# Patient Record
Sex: Male | Born: 1980 | Race: White | Hispanic: No | Marital: Single | Smoking: Current every day smoker
Health system: Southern US, Community
[De-identification: ages and names within clinical notes are randomized; demographics above are authoritative.]

## PROBLEM LIST (undated history)

## (undated) DIAGNOSIS — F419 Anxiety disorder, unspecified: Secondary | ICD-10-CM

## (undated) DIAGNOSIS — B20 Human immunodeficiency virus [HIV] disease: Secondary | ICD-10-CM

## (undated) DIAGNOSIS — Z21 Asymptomatic human immunodeficiency virus [HIV] infection status: Secondary | ICD-10-CM

## (undated) DIAGNOSIS — M419 Scoliosis, unspecified: Secondary | ICD-10-CM

## (undated) HISTORY — DX: Asymptomatic human immunodeficiency virus (hiv) infection status: Z21

## (undated) HISTORY — PX: FOOT SURGERY: SHX648

## (undated) HISTORY — DX: Scoliosis, unspecified: M41.9

## (undated) HISTORY — PX: SPINE SURGERY: SHX786

## (undated) HISTORY — DX: Human immunodeficiency virus (HIV) disease: B20

## (undated) HISTORY — DX: Anxiety disorder, unspecified: F41.9

---

## 2005-08-07 ENCOUNTER — Ambulatory Visit: Payer: Self-pay | Admitting: Infectious Diseases

## 2005-09-04 ENCOUNTER — Ambulatory Visit: Payer: Self-pay | Admitting: Infectious Diseases

## 2005-10-01 ENCOUNTER — Ambulatory Visit: Payer: Self-pay | Admitting: Family Medicine

## 2005-11-25 ENCOUNTER — Ambulatory Visit: Payer: Self-pay | Admitting: Infectious Diseases

## 2005-11-25 ENCOUNTER — Encounter: Admission: RE | Admit: 2005-11-25 | Discharge: 2005-11-25 | Payer: Self-pay | Admitting: Infectious Diseases

## 2005-11-25 ENCOUNTER — Encounter (INDEPENDENT_AMBULATORY_CARE_PROVIDER_SITE_OTHER): Payer: Self-pay | Admitting: *Deleted

## 2005-11-25 LAB — CONVERTED CEMR LAB: CD4 Count: 400 microliters

## 2005-11-28 ENCOUNTER — Emergency Department (HOSPITAL_COMMUNITY): Admission: EM | Admit: 2005-11-28 | Discharge: 2005-11-28 | Payer: Self-pay | Admitting: Family Medicine

## 2005-12-25 ENCOUNTER — Ambulatory Visit: Payer: Self-pay | Admitting: Infectious Diseases

## 2006-01-24 ENCOUNTER — Emergency Department (HOSPITAL_COMMUNITY): Admission: EM | Admit: 2006-01-24 | Discharge: 2006-01-24 | Payer: Self-pay | Admitting: Family Medicine

## 2006-05-12 ENCOUNTER — Ambulatory Visit: Payer: Self-pay | Admitting: Family Medicine

## 2006-07-23 ENCOUNTER — Encounter: Admission: RE | Admit: 2006-07-23 | Discharge: 2006-07-23 | Payer: Self-pay | Admitting: Infectious Diseases

## 2006-07-23 ENCOUNTER — Encounter (INDEPENDENT_AMBULATORY_CARE_PROVIDER_SITE_OTHER): Payer: Self-pay | Admitting: *Deleted

## 2006-07-23 ENCOUNTER — Ambulatory Visit: Payer: Self-pay | Admitting: Infectious Diseases

## 2006-07-23 LAB — CONVERTED CEMR LAB
ALT: 34 units/L (ref 0–40)
Albumin: 4.5 g/dL (ref 3.5–5.2)
Basophils Absolute: 0 10*3/uL (ref 0.0–0.1)
CD4 Count: 690 microliters
CO2: 26 meq/L (ref 19–32)
Calcium: 9 mg/dL (ref 8.4–10.5)
Chloride: 105 meq/L (ref 96–112)
Eosinophils Relative: 2 % (ref 0–5)
Glucose, Bld: 101 mg/dL — ABNORMAL HIGH (ref 70–99)
HCT: 40.2 % (ref 39.0–52.0)
HIV 1 RNA Quant: 49 copies/mL
HIV 1 RNA Quant: <50 copies/mL (ref <50)
HIV-1 RNA Quant, Log: <1.7 (ref <1.70)
Leukocyte count, blood: 10.9 10*9/L — ABNORMAL HIGH (ref 4.0–10.5)
Lymphocytes Relative: 23 % (ref 12–46)
Lymphs Abs: 2.5 10*3/uL (ref 0.7–3.3)
Neutro Abs: 7.4 10*3/uL (ref 1.7–7.7)
Platelets: 261 10*3/uL (ref 150–400)
Potassium: 4.4 meq/L (ref 3.5–5.3)
RDW: 12.8 % (ref 11.5–14.0)
Sodium: 142 meq/L (ref 135–145)
Total Protein: 7 g/dL (ref 6.0–8.3)

## 2006-07-31 DIAGNOSIS — B20 Human immunodeficiency virus [HIV] disease: Secondary | ICD-10-CM | POA: Insufficient documentation

## 2006-08-11 ENCOUNTER — Emergency Department (HOSPITAL_COMMUNITY): Admission: EM | Admit: 2006-08-11 | Discharge: 2006-08-11 | Payer: Self-pay | Admitting: Family Medicine

## 2006-08-21 ENCOUNTER — Ambulatory Visit: Payer: Self-pay | Admitting: Infectious Diseases

## 2006-09-18 ENCOUNTER — Ambulatory Visit: Payer: Self-pay | Admitting: Family Medicine

## 2006-09-22 ENCOUNTER — Emergency Department (HOSPITAL_COMMUNITY): Admission: EM | Admit: 2006-09-22 | Discharge: 2006-09-22 | Payer: Self-pay | Admitting: Emergency Medicine

## 2006-09-24 ENCOUNTER — Ambulatory Visit: Payer: Self-pay | Admitting: Family Medicine

## 2006-09-29 ENCOUNTER — Ambulatory Visit: Payer: Self-pay | Admitting: Family Medicine

## 2006-10-09 ENCOUNTER — Ambulatory Visit: Payer: Self-pay | Admitting: Family Medicine

## 2006-11-17 ENCOUNTER — Ambulatory Visit: Payer: Self-pay | Admitting: Infectious Diseases

## 2006-11-17 ENCOUNTER — Encounter (INDEPENDENT_AMBULATORY_CARE_PROVIDER_SITE_OTHER): Payer: Self-pay | Admitting: *Deleted

## 2006-11-17 ENCOUNTER — Encounter: Admission: RE | Admit: 2006-11-17 | Discharge: 2006-11-17 | Payer: Self-pay | Admitting: Infectious Diseases

## 2006-11-17 LAB — CONVERTED CEMR LAB
ALT: 24 units/L (ref 0–53)
AST: 18 units/L (ref 0–37)
Albumin: 4.7 g/dL (ref 3.5–5.2)
Alkaline Phosphatase: 88 units/L (ref 39–117)
BUN: 10 mg/dL (ref 6–23)
Basophils Absolute: 0 10*3/uL (ref 0.0–0.1)
Basophils Relative: 0 % (ref 0–1)
Eosinophils Absolute: 0.2 10*3/uL (ref 0.0–0.7)
MCHC: 33.7 g/dL (ref 30.0–36.0)
MCV: 96.4 fL (ref 78.0–100.0)
Monocytes Relative: 6 % (ref 3–11)
Neutro Abs: 7.3 10*3/uL (ref 1.7–7.7)
Neutrophils Relative %: 68 % (ref 43–77)
Platelets: 298 10*3/uL (ref 150–400)
Potassium: 4 meq/L (ref 3.5–5.3)
RBC: 4.46 M/uL (ref 4.22–5.81)
RDW: 12.8 % (ref 11.5–14.0)

## 2006-11-30 ENCOUNTER — Encounter (INDEPENDENT_AMBULATORY_CARE_PROVIDER_SITE_OTHER): Payer: Self-pay | Admitting: *Deleted

## 2006-12-02 ENCOUNTER — Encounter (INDEPENDENT_AMBULATORY_CARE_PROVIDER_SITE_OTHER): Payer: Self-pay | Admitting: *Deleted

## 2006-12-02 ENCOUNTER — Encounter: Payer: Self-pay | Admitting: Infectious Diseases

## 2007-01-26 ENCOUNTER — Ambulatory Visit: Payer: Self-pay | Admitting: Family Medicine

## 2007-02-13 ENCOUNTER — Emergency Department (HOSPITAL_COMMUNITY): Admission: EM | Admit: 2007-02-13 | Discharge: 2007-02-13 | Payer: Self-pay | Admitting: Family Medicine

## 2007-03-11 ENCOUNTER — Emergency Department (HOSPITAL_COMMUNITY): Admission: EM | Admit: 2007-03-11 | Discharge: 2007-03-11 | Payer: Self-pay | Admitting: Emergency Medicine

## 2007-05-19 ENCOUNTER — Emergency Department (HOSPITAL_COMMUNITY): Admission: EM | Admit: 2007-05-19 | Discharge: 2007-05-19 | Payer: Self-pay | Admitting: Emergency Medicine

## 2007-06-10 ENCOUNTER — Telehealth: Payer: Self-pay | Admitting: Infectious Diseases

## 2007-06-10 DIAGNOSIS — L988 Other specified disorders of the skin and subcutaneous tissue: Secondary | ICD-10-CM

## 2007-06-11 ENCOUNTER — Telehealth: Payer: Self-pay | Admitting: Internal Medicine

## 2007-06-17 ENCOUNTER — Encounter: Payer: Self-pay | Admitting: Infectious Diseases

## 2007-07-28 ENCOUNTER — Telehealth: Payer: Self-pay | Admitting: Infectious Diseases

## 2007-09-22 ENCOUNTER — Encounter: Payer: Self-pay | Admitting: Infectious Diseases

## 2007-09-22 ENCOUNTER — Encounter (INDEPENDENT_AMBULATORY_CARE_PROVIDER_SITE_OTHER): Payer: Self-pay | Admitting: *Deleted

## 2007-11-25 ENCOUNTER — Telehealth (INDEPENDENT_AMBULATORY_CARE_PROVIDER_SITE_OTHER): Payer: Self-pay | Admitting: *Deleted

## 2007-12-11 ENCOUNTER — Encounter (INDEPENDENT_AMBULATORY_CARE_PROVIDER_SITE_OTHER): Payer: Self-pay | Admitting: *Deleted

## 2007-12-29 ENCOUNTER — Encounter (INDEPENDENT_AMBULATORY_CARE_PROVIDER_SITE_OTHER): Payer: Self-pay | Admitting: *Deleted

## 2008-01-14 ENCOUNTER — Encounter: Payer: Self-pay | Admitting: Infectious Diseases

## 2008-01-28 ENCOUNTER — Telehealth: Payer: Self-pay

## 2008-04-28 ENCOUNTER — Encounter: Admission: RE | Admit: 2008-04-28 | Discharge: 2008-04-28 | Payer: Self-pay | Admitting: Infectious Diseases

## 2008-04-28 ENCOUNTER — Ambulatory Visit: Payer: Self-pay | Admitting: Infectious Diseases

## 2008-04-28 LAB — CONVERTED CEMR LAB
ALT: 26 units/L (ref 0–53)
AST: 21 units/L (ref 0–37)
Albumin: 4.7 g/dL (ref 3.5–5.2)
Alkaline Phosphatase: 64 units/L (ref 39–117)
CO2: 23 meq/L (ref 19–32)
Calcium: 9.3 mg/dL (ref 8.4–10.5)
Eosinophils Relative: 2 % (ref 0–5)
GC Probe Amp, Urine: NEGATIVE
Glucose, Bld: 99 mg/dL (ref 70–99)
HCT: 40.9 % (ref 39.0–52.0)
HIV-1 RNA Quant, Log: <1.7 (ref <1.70)
Hemoglobin, Urine: NEGATIVE
Hemoglobin: 14 g/dL (ref 13.0–17.0)
Ketones, ur: NEGATIVE mg/dL
LDL Cholesterol: 87 mg/dL (ref 0–99)
Leukocytes, UA: NEGATIVE
Lymphocytes Relative: 22 % (ref 12–46)
Lymphs Abs: 2.8 10*3/uL (ref 0.7–4.0)
MCV: 97.6 fL (ref 78.0–100.0)
Monocytes Absolute: 0.9 10*3/uL (ref 0.1–1.0)
Monocytes Relative: 7 % (ref 3–12)
Nitrite: NEGATIVE
Specific Gravity, Urine: 1.036 — ABNORMAL HIGH (ref 1.005–1.03)
Urobilinogen, UA: 0.2 (ref 0.0–1.0)
WBC: 12.8 10*3/uL — ABNORMAL HIGH (ref 4.0–10.5)
pH: 6 (ref 5.0–8.0)

## 2008-05-12 ENCOUNTER — Ambulatory Visit: Payer: Self-pay | Admitting: Infectious Diseases

## 2008-07-15 ENCOUNTER — Encounter: Payer: Self-pay | Admitting: Infectious Diseases

## 2008-07-19 ENCOUNTER — Telehealth (INDEPENDENT_AMBULATORY_CARE_PROVIDER_SITE_OTHER): Payer: Self-pay | Admitting: *Deleted

## 2008-07-20 ENCOUNTER — Telehealth (INDEPENDENT_AMBULATORY_CARE_PROVIDER_SITE_OTHER): Payer: Self-pay | Admitting: *Deleted

## 2008-07-26 ENCOUNTER — Telehealth: Payer: Self-pay | Admitting: Infectious Diseases

## 2008-08-01 ENCOUNTER — Encounter: Payer: Self-pay | Admitting: Infectious Diseases

## 2008-08-01 DIAGNOSIS — M549 Dorsalgia, unspecified: Secondary | ICD-10-CM | POA: Insufficient documentation
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
## 2008-11-29 ENCOUNTER — Encounter (INDEPENDENT_AMBULATORY_CARE_PROVIDER_SITE_OTHER): Payer: Self-pay | Admitting: *Deleted

## 2008-12-14 ENCOUNTER — Encounter (INDEPENDENT_AMBULATORY_CARE_PROVIDER_SITE_OTHER): Payer: Self-pay | Admitting: *Deleted

## 2009-07-26 ENCOUNTER — Ambulatory Visit: Payer: Self-pay | Admitting: Infectious Diseases

## 2009-07-26 LAB — CONVERTED CEMR LAB
ALT: 24 units/L (ref 0–53)
AST: 19 units/L (ref 0–37)
Alkaline Phosphatase: 65 units/L (ref 39–117)
BUN: 8 mg/dL (ref 6–23)
Basophils Absolute: 0 10*3/uL (ref 0.0–0.1)
Basophils Relative: 0 % (ref 0–1)
Calcium: 9 mg/dL (ref 8.4–10.5)
Creatinine, Ser: 0.78 mg/dL (ref 0.40–1.50)
Eosinophils Absolute: 0.3 10*3/uL (ref 0.0–0.7)
Eosinophils Relative: 3 % (ref 0–5)
HCT: 39.4 % (ref 39.0–52.0)
HDL: 36 mg/dL — ABNORMAL LOW (ref >39)
HIV 1 RNA Quant: <48 copies/mL (ref <48)
LDL Cholesterol: 84 mg/dL (ref 0–99)
Lymphs Abs: 3.2 10*3/uL (ref 0.7–4.0)
MCV: 95.2 fL (ref >=78.0)
Neutrophils Relative %: 59 % (ref 43–77)
Platelets: 291 10*3/uL (ref 150–400)
RDW: 13 % (ref 11.5–15.5)
Total Bilirubin: 0.2 mg/dL — ABNORMAL LOW (ref 0.3–1.2)
Total CHOL/HDL Ratio: 3.8
VLDL: 18 mg/dL (ref 0–40)
WBC: 10.2 10*3/uL (ref 4.0–10.5)

## 2009-08-10 ENCOUNTER — Ambulatory Visit: Payer: Self-pay | Admitting: Infectious Diseases

## 2009-08-10 ENCOUNTER — Ambulatory Visit (HOSPITAL_COMMUNITY): Admission: RE | Admit: 2009-08-10 | Discharge: 2009-08-10 | Payer: Self-pay | Admitting: Infectious Diseases

## 2009-12-18 ENCOUNTER — Encounter (INDEPENDENT_AMBULATORY_CARE_PROVIDER_SITE_OTHER): Payer: Self-pay | Admitting: *Deleted

## 2010-01-09 ENCOUNTER — Emergency Department (HOSPITAL_COMMUNITY): Admission: EM | Admit: 2010-01-09 | Discharge: 2010-01-09 | Payer: Self-pay | Admitting: Family Medicine

## 2010-01-11 ENCOUNTER — Ambulatory Visit: Payer: Self-pay | Admitting: Infectious Diseases

## 2010-01-11 DIAGNOSIS — F411 Generalized anxiety disorder: Secondary | ICD-10-CM | POA: Insufficient documentation

## 2010-01-30 ENCOUNTER — Ambulatory Visit: Payer: Self-pay | Admitting: Infectious Diseases

## 2010-01-30 LAB — CONVERTED CEMR LAB
ALT: 17 units/L (ref 0–53)
Alkaline Phosphatase: 65 units/L (ref 39–117)
Basophils Absolute: 0 10*3/uL (ref 0.0–0.1)
Basophils Relative: 0 % (ref 0–1)
CO2: 25 meq/L (ref 19–32)
Creatinine, Ser: 0.81 mg/dL (ref 0.40–1.50)
Eosinophils Absolute: 0.2 10*3/uL (ref 0.0–0.7)
Glucose, Bld: 84 mg/dL (ref 70–99)
HIV 1 RNA Quant: <48 copies/mL (ref <48)
HIV-1 RNA Quant, Log: <1.68 (ref <1.68)
MCHC: 33.3 g/dL (ref 30.0–36.0)
MCV: 95.2 fL (ref 78.0–100.0)
Monocytes Relative: 4 % (ref 3–12)
Neutrophils Relative %: 67 % (ref 43–77)
RBC: 4.33 M/uL (ref 4.22–5.81)
RDW: 12.9 % (ref 11.5–15.5)
Total Bilirubin: 0.2 mg/dL — ABNORMAL LOW (ref 0.3–1.2)

## 2010-02-08 ENCOUNTER — Ambulatory Visit: Payer: Self-pay | Admitting: Infectious Diseases

## 2010-02-13 ENCOUNTER — Telehealth: Payer: Self-pay | Admitting: Infectious Diseases

## 2010-04-17 ENCOUNTER — Telehealth (INDEPENDENT_AMBULATORY_CARE_PROVIDER_SITE_OTHER): Payer: Self-pay | Admitting: *Deleted

## 2010-05-31 ENCOUNTER — Ambulatory Visit: Payer: Self-pay | Admitting: Infectious Diseases

## 2010-05-31 LAB — CONVERTED CEMR LAB
ALT: 12 units/L (ref 0–53)
BUN: 6 mg/dL (ref 6–23)
CO2: 28 meq/L (ref 19–32)
Calcium: 8.8 mg/dL (ref 8.4–10.5)
Chloride: 108 meq/L (ref 96–112)
Creatinine, Ser: 0.72 mg/dL (ref 0.40–1.50)
Glucose, Bld: 73 mg/dL (ref 70–99)
HIV-1 RNA Quant, Log: <1.3 (ref <1.68)
Hemoglobin: 12.7 g/dL — ABNORMAL LOW (ref 13.0–17.0)
Lymphocytes Relative: 37 % (ref 12–46)
Monocytes Absolute: 0.4 10*3/uL (ref 0.1–1.0)
Monocytes Relative: 6 % (ref 3–12)
Neutro Abs: 4.1 10*3/uL (ref 1.7–7.7)
RBC: 3.97 M/uL — ABNORMAL LOW (ref 4.22–5.81)
Testosterone: 445.24 ng/dL (ref 350–890)

## 2010-06-14 ENCOUNTER — Ambulatory Visit: Payer: Self-pay | Admitting: Infectious Diseases

## 2010-09-04 ENCOUNTER — Telehealth: Payer: Self-pay | Admitting: Infectious Diseases

## 2010-09-11 ENCOUNTER — Telehealth: Payer: Self-pay | Admitting: Infectious Diseases

## 2010-10-17 ENCOUNTER — Ambulatory Visit: Admit: 2010-10-17 | Payer: Self-pay | Admitting: Infectious Diseases

## 2010-10-23 NOTE — Assessment & Plan Note (Signed)
Summary: back pain also X-ray result   CC:  Out of pain med, to discuss "anxiety" with MD.  RN offered Counselor, Aldean Ast, and as a method of coping with "every day stress.".  Preventive Screening-Counseling & Management  Alcohol-Tobacco     Alcohol drinks/day: 0     Smoking Status: current     Smoking Cessation Counseling: yes     Smoke Cessation Stage: contemplative     Packs/Day: 0.5     Passive Smoke Exposure: no  Caffeine-Diet-Exercise     Caffeine use/day: 3     Does Patient Exercise: yes     Type of exercise: gym membership     Times/week: 3  Hep-HIV-STD-Contraception     HIV Risk: no risk noted     HIV Risk Counseling: not indicated-no HIV risk noted  Safety-Violence-Falls     Seat Belt Use: 100  Comments: declined condoms      Sexual History:  n/a.        Drug Use:  former.     Current Allergies (reviewed today): ! PCN Social History: Sexual History:  n/a Drug Use:  former  Vital Signs:  Patient profile:   30 year old male Height:      71 inches (180.34 cm) Weight:      161.5 pounds (73.41 kg) BMI:     22.61 Temp:     98.8 degrees F (37.11 degrees C) oral Pulse rate:   103 / minute BP sitting:   136 / 80  (left arm) Cuff size:   regular  Vitals Entered By: Jennet Maduro RN (January 11, 2010 2:54 PM) CC: Out of pain med, to discuss "anxiety" with MD.  RN offered Counselor, Aldean Ast, as a method of coping with "every day stress." Is Patient Diabetic? No Pain Assessment Patient in pain? yes     Location: back Intensity: 8 Type: aching Onset of pain  Chronic, "going down legs" Nutritional Status BMI of 19 -24 = normal Nutritional Status Detail appetite "unchanged"  Have you ever been in a relationship where you felt threatened, hurt or afraid?No   Does patient need assistance? Functional Status Self care Ambulation Normal Comments missed 2 doses a couple of months ago    Medications Added to Medication List This Visit: 1)   Norco 7.5-325 Mg Tabs (Hydrocodone-acetaminophen) .... Take 1 tablet by mouth every 6 hours as needed back pain 2)  Hydrocodone-acetaminophen 10-325 Mg Tabs (Hydrocodone-acetaminophen) .... Take 1/2 tablet by mouth every 6 hours as needed back pain 3)  Hydrocodone-acetaminophen 5-325 Mg Tabs (Hydrocodone-acetaminophen) .... Take 1 tablet by mouth every 6 hours as needed for pain 4)  Alprazolam 0.5 Mg Tabs (Alprazolam) .Marland Kitchen.. 1 tab three times a day as needed  Other Orders: Misc. Referral (Misc. Ref) Est. Patient Level IV (09811) Prescriptions: NORCO 7.5-325 MG TABS (HYDROCODONE-ACETAMINOPHEN) Take 1 tablet by mouth every 6 hours as needed back pain  #120 x 5   Entered by:   Jennet Maduro RN   Authorized by:   Lina Sayre MD   Signed by:   Jennet Maduro RN on 01/11/2010   Method used:   Print then Give to Patient   RxID:   [id number] HYDROCODONE-ACETAMINOPHEN 10-325 MG TABS (HYDROCODONE-ACETAMINOPHEN) Take 1/2 tablet by mouth every 6 hours as needed back pain  #90 x 5   Entered by:   Jennet Maduro RN   Authorized by:   Lina Sayre MD   Signed by:   Jennet Maduro RN on 01/11/2010  Method used:   Print then Give to Patient   RxID:   [id number] ALPRAZOLAM 0.5 MG TABS (ALPRAZOLAM) 1 tab three times a day as needed  #90 x 5   Entered by:   Jennet Maduro RN   Authorized by:   Lina Sayre MD   Signed by:   Jennet Maduro RN on 01/11/2010   Method used:   Print then Give to Patient   RxID:   [id number] HYDROCODONE-ACETAMINOPHEN 7.5-500 MG TABS (HYDROCODONE-ACETAMINOPHEN) Take 1 tablet by mouth every 6 hours as needed back pain  #120 x 5   Entered by:   Jennet Maduro RN   Authorized by:   Lina Sayre MD   Signed by:   Jennet Maduro RN on 01/11/2010   Method used:   Print then Give to Patient   RxID:   [id number] ALPRAZOLAM 0.5 MG TABS (ALPRAZOLAM) 1 tab three times a day as needed  #90 x 5   Entered and Authorized by:   Lina Sayre MD   Signed by:    Lina Sayre MD on 01/11/2010   Method used:   Print then Give to Patient   RxID:   [id number] HYDROCODONE-ACETAMINOPHEN 7.5-500 MG TABS (HYDROCODONE-ACETAMINOPHEN) Take 1 tablet by mouth every 6 hours as needed back pain  #120 x 5   Entered and Authorized by:   Lina Sayre MD   Signed by:   Lina Sayre MD on 01/11/2010   Method used:   Print then Give to Patient   RxID:   [id number]  Hydrocodone dose changed due to needing to reduce amount of apap. Jennet Maduro RN  January 11, 2010  3:53 PM  Prescription changed again after pt. returned to office wanting increased amount of pills thinking the dose was supposed to be 1/2 to 1 tablet every 6 hours.  RN spoke to Dr. Maurice March.  Dr. Ambrose Pancoast changed the medication to 7.5 / 325 hydrocodone/APAP tablets Take 1 tablet by mouth every 6 hours as needed pain.  New prescription printed.  RN went to talk with pt. at the front desk.  He had already had the rx filled at the Pharmacy.  RN reviewed how Dr. Maurice March wanted him to take the medication, the amount of hydrocodone in each tablet, and not take too much pain  medication or APAP.  Pt. agreed that he would take only 1/2 tablet of the 10/325 strength tablets.  Rx for hydrocodone 7.5/ 325 tablets was destroyed in front of Dr. Maurice March. Jennet Maduro RN  January 11, 2010 4:57 PM

## 2010-10-23 NOTE — Progress Notes (Signed)
Summary: Request early refill on Hydrocodone, NOT authorizded  Phone Note From Pharmacy   Caller: [facility] Details for Reason: Request early refill on Hydrocodone Summary of Call: Received a voicemail message in reference to [employer].  Tammy said pateint called to tell them he wants a refill on his pain med because he lost medication down the drain.  Tammy said she remembers there being an issue about this medication on 5/16 with the quantity.  She can't fill it without Dr. Maurice March saying it okay for an early refill.  she would like for somone to call her back at [phone] or [phone] Initial call taken by: Paulo Fruit  BS,CPht II,MPH,  Feb 13, 2010 2:50 PM  Follow-up for Phone Call        RN spoke with [facility].  Pt. originally filled the 10/325 mg rx on 01/11/2010 for 60 tablets.  He refilled the rx on 02/06/20 (early refill) for 60 tablets.  RN will check with Dr. Maurice March today about refilling the rx due to the pt. loosing a portion of the rx "down the drain" at home. Jennet Maduro RN  Feb 15, 2010 10:02 AM RN spoke with Dr. Maurice March.  He will NOT authorize a refill.  The pt. will need to wait until it is time to refill the rx.  Dr. Maurice March stated that the pt. needs to take better care of his medications.  RN phoned [facility] and shared this message.  Pharmacist repeated the order. Jennet Maduro RN  Feb 15, 2010 10:22 AM     New/Updated Medications: HYDROCODONE-ACETAMINOPHEN 10-325 MG TABS (HYDROCODONE-ACETAMINOPHEN) Take 1/2 tablet by mouth every 6 hours as needed for back pain
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
## 2010-10-23 NOTE — Miscellaneous (Signed)
Summary: clinical update/ryan white [place] app completed  Clinical Lists Changes  Observations: Added new observation of PCTFPL: 93.14  (12/18/2009 14:44) Added new observation of INCOMESOURCE: Other  (12/18/2009 14:44) Added new observation of HOUSEINCOME: 16[address]  (12/18/2009 14:44) Added new observation of FINASSESSDT: 12/13/2009  (12/18/2009 14:44)

## 2010-10-23 NOTE — Progress Notes (Signed)
Summary: New ADAP pharmacy.  Phone Note Call from Patient   Caller: Patient Reason for Call: Refill Medication Summary of Call: Patient called stating that he did not know that the ADAP pharmacy changed from [facility] to [facility].  He did contact the [facility] and they did not have him in their system.  Patient has 4 days left on his medications [facility]  He would like prescriptions called in if possible. Initial call taken by: Paulo Fruit  BS,CPht II,MPH,  April 17, 2010 12:41 PM    Prescriptions: ATRIPLA 600-200-300 MG TABS (EFAVIRENZ-EMTRICITAB-TENOFOVIR) Take 1 tablet by mouth at bedtime  #30 x 11   Entered by:   Paulo Fruit  BS,CPht II,MPH   Authorized by:   Lina Sayre MD   Signed by:   Paulo Fruit  BS,CPht II,MPH on 04/17/2010   Method used:   Electronically to        [facility] [phone]* (retail)       [address]       [place], [place]  [address]       Ph: [phone]       Fax:    RxID:   [id number]  Paulo Fruit  BS,CPht II,MPH  April 17, 2010 12:42 PM

## 2010-10-25 NOTE — Progress Notes (Addendum)
Summary: Pain rx request, MD notified, pt given script  Phone Note Call from Patient Call back at cell [phone]   Caller: Patient Call For: Lina Sayre MD Reason for Call: Refill Medication Summary of Call: Pt. requesting refills on Hydrocodone/APAP 10/325 and Alprazolam 0.5 mg tabs.  RN will let Dr. Maurice March know about this request.  RN emailed  Dr. Maurice March with this request.  Jennet Maduro RN  September 04, 2010 4:25 PM   Follow-up for Phone Call        pt. had #90 with 5 refills in September Follow-up by: Wendall Mola CMA Duncan Dull),  September 07, 2010 4:57 PM    Prescriptions: HYDROCODONE-ACETAMINOPHEN 10-325 MG TABS (HYDROCODONE-ACETAMINOPHEN) Take 1/2 tablet by mouth every 6 hours as needed for back pain  #60 x 0   Entered by:   Jennet Maduro RN   Authorized by:   Talmadge Chad NP   Signed by:   Jennet Maduro RN on 09/10/2010   Method used:   Print then Give to Patient   RxID:   [id number]  Pt. picked up script today. Jennet Maduro RN  September 10, 2010 3:39 PM

## 2010-11-29 NOTE — Assessment & Plan Note (Signed)
Summary: F/U OV/VS   CC:  follow-up visit.  Preventive Screening-Counseling & Management  Alcohol-Tobacco     Alcohol drinks/day: 0     Smoking Status: current     Smoking Cessation Counseling: yes     Smoke Cessation Stage: contemplative     Packs/Day: 0.25     Passive Smoke Exposure: no  Caffeine-Diet-Exercise     Caffeine use/day: sodas     Does Patient Exercise: yes     Type of exercise: gym membership     Exercise (avg: min/session): 30-60     Times/week: 3  Safety-Violence-Falls     Seat Belt Use: yes   Current Allergies (reviewed today): ! PCN Vital Signs:  Patient profile:   30 year old male Height:      71 inches (180.34 cm) Weight:      161.0 pounds (73.18 kg) BMI:     22.54 Temp:     98.5 degrees F (36.94 degrees C) oral Pulse rate:   96 / minute BP sitting:   125 / 78  (left arm)  Vitals Entered By: Baxter Hire) (June 14, 2010 3:14 PM) CC: follow-up visit Pain Assessment Patient in pain? no      Nutritional Status BMI of 19 -24 = normal Nutritional Status Detail appetite is okay per patient  Have you ever been in a relationship where you felt threatened, hurt or afraid?No   Does patient need assistance? Functional Status Self care Ambulation Normal    Other Orders: Est. Patient Level III (16109) Future Orders: T-CBC w/Diff ([id number]) ... 11/14/2010 T-CD4SP ([facility]) (CD4SP) ... 11/14/2010 T-Comprehensive Metabolic Panel [phone]) ... 11/14/2010 T-HIV Viral Load [phone]) ... 11/14/2010  Patient Instructions: 1)  Please schedule a follow-up appointment in 4-6 months. 2)  Be sure to return for lab work one (1) week before your next appointment as scheduled.  Process Orders Check Orders Results:     [facility]: ABN not required for this insurance Tests Sent for requisitioning (November 20, 2010 10:37 AM):     11/14/2010: [facility] -- T-CBC w/Diff [[id number]] (signed)  11/14/2010: [facility] -- T-Comprehensive Metabolic Panel [[id number]] (signed)     11/14/2010: [facility] -- T-HIV Viral Load [phone] (signed)    Process Orders Check Orders Results:     [facility]: ABN not required for this insurance Tests Sent for requisitioning (November 20, 2010 10:37 AM):     11/14/2010: [facility] -- T-CBC w/Diff [[id number]] (signed)     11/14/2010: [facility] -- T-Comprehensive Metabolic Panel [[id number]] (signed)     11/14/2010: [facility] -- T-HIV Viral Load [phone] (signed)

## 2010-11-29 NOTE — Progress Notes (Signed)
Summary: Pharmacy calling re Narcotic script  Phone Note From Pharmacy   Caller: [facility]  [phone] Summary of Call: Pt says this is not his correct script the dose and the number is not correct.  Per Nida Boatman the script is was written according to the directions he received from Dr Maurice March , no exceptions. Tomasita Morrow RN  September 11, 2010 4:52 PM  Initial call taken by: Tomasita Morrow RN,  September 11, 2010 4:52 PM

## 2010-12-06 LAB — T-HELPER CELL (CD4) - (RCID CLINIC ONLY): CD4 T Cell Abs: 1050 uL (ref 400–2700)

## 2010-12-11 LAB — POCT RAPID STREP A (OFFICE): Streptococcus, Group A Screen (Direct): NEGATIVE

## 2010-12-13 ENCOUNTER — Other Ambulatory Visit: Payer: Self-pay

## 2010-12-13 DIAGNOSIS — B20 Human immunodeficiency virus [HIV] disease: Secondary | ICD-10-CM

## 2010-12-14 LAB — CBC WITH DIFFERENTIAL/PLATELET
Basophils Relative: 0 % (ref 0–1)
HCT: 41.8 % (ref 39.0–52.0)
Hemoglobin: 14.5 g/dL (ref 13.0–17.0)
MCHC: 34.7 g/dL (ref 30.0–36.0)
Monocytes Absolute: 0.7 10*3/uL (ref 0.1–1.0)
Monocytes Relative: 6 % (ref 3–12)
Neutro Abs: 7.9 10*3/uL — ABNORMAL HIGH (ref 1.7–7.7)

## 2010-12-14 LAB — COMPLETE METABOLIC PANEL WITH GFR
Albumin: 4.7 g/dL (ref 3.5–5.2)
Alkaline Phosphatase: 76 U/L (ref 39–117)
BUN: 9 mg/dL (ref 6–23)
Calcium: 9.6 mg/dL (ref 8.4–10.5)
Chloride: 105 mEq/L (ref 96–112)
GFR, Est African American: >60 mL/min (ref >60)
GFR, Est Non African American: >60 mL/min (ref >60)
Glucose, Bld: 103 mg/dL — ABNORMAL HIGH (ref 70–99)
Potassium: 4.4 mEq/L (ref 3.5–5.3)

## 2010-12-14 LAB — CD4/CD8 (T-HELPER/T-SUPPRESSOR CELL): Ratio: 0.78 — ABNORMAL LOW (ref 1.0–3.0)

## 2010-12-15 LAB — HIV-1 RNA QUANT-NO REFLEX-BLD: HIV 1 RNA Quant: 27 copies/mL — ABNORMAL HIGH (ref <20)

## 2010-12-26 LAB — T-HELPER CELL (CD4) - (RCID CLINIC ONLY)
CD4 % Helper T Cell: 33 % (ref 33–55)
CD4 T Cell Abs: 990 uL (ref 400–2700)

## 2010-12-27 ENCOUNTER — Ambulatory Visit: Payer: Self-pay | Admitting: Infectious Diseases

## 2011-02-07 ENCOUNTER — Ambulatory Visit: Payer: Self-pay | Admitting: Infectious Diseases

## 2011-02-13 ENCOUNTER — Ambulatory Visit (INDEPENDENT_AMBULATORY_CARE_PROVIDER_SITE_OTHER): Payer: Self-pay

## 2011-02-13 ENCOUNTER — Inpatient Hospital Stay (INDEPENDENT_AMBULATORY_CARE_PROVIDER_SITE_OTHER)
Admission: RE | Admit: 2011-02-13 | Discharge: 2011-02-13 | Disposition: A | Discharge status: Home / Self Care | Payer: Self-pay | Source: Ambulatory Visit | Attending: Emergency Medicine | Admitting: Emergency Medicine

## 2011-02-13 DIAGNOSIS — S9030XA Contusion of unspecified foot, initial encounter: Secondary | ICD-10-CM

## 2011-03-14 ENCOUNTER — Encounter: Payer: Self-pay | Admitting: Infectious Diseases

## 2011-03-14 ENCOUNTER — Other Ambulatory Visit: Payer: Self-pay | Admitting: *Deleted

## 2011-03-14 ENCOUNTER — Ambulatory Visit (INDEPENDENT_AMBULATORY_CARE_PROVIDER_SITE_OTHER): Payer: Self-pay | Admitting: Infectious Diseases

## 2011-03-14 DIAGNOSIS — F419 Anxiety disorder, unspecified: Secondary | ICD-10-CM

## 2011-03-14 DIAGNOSIS — Z113 Encounter for screening for infections with a predominantly sexual mode of transmission: Secondary | ICD-10-CM

## 2011-03-14 DIAGNOSIS — R52 Pain, unspecified: Secondary | ICD-10-CM

## 2011-03-14 DIAGNOSIS — N529 Male erectile dysfunction, unspecified: Secondary | ICD-10-CM

## 2011-03-14 DIAGNOSIS — Z79899 Other long term (current) drug therapy: Secondary | ICD-10-CM

## 2011-03-14 DIAGNOSIS — B20 Human immunodeficiency virus [HIV] disease: Secondary | ICD-10-CM

## 2011-03-14 LAB — CBC WITH DIFFERENTIAL/PLATELET
Basophils Absolute: 0.1 10*3/uL (ref 0.0–0.1)
Eosinophils Relative: 3 % (ref 0–5)
HCT: 40.9 % (ref 39.0–52.0)
Hemoglobin: 14.3 g/dL (ref 13.0–17.0)
Lymphocytes Relative: 33 % (ref 12–46)
Lymphs Abs: 2.6 10*3/uL (ref 0.7–4.0)
MCV: 94.2 fL (ref 78.0–100.0)
Monocytes Absolute: 0.6 10*3/uL (ref 0.1–1.0)
Monocytes Relative: 8 % (ref 3–12)
Neutro Abs: 4.2 10*3/uL (ref 1.7–7.7)
RBC: 4.34 MIL/uL (ref 4.22–5.81)
RDW: 13 % (ref 11.5–15.5)
WBC: 7.6 10*3/uL (ref 4.0–10.5)

## 2011-03-14 LAB — LIPID PANEL
Cholesterol: 164 mg/dL (ref 0–200)
Triglycerides: 49 mg/dL (ref <150)

## 2011-03-14 MED ORDER — HYDROCODONE-ACETAMINOPHEN 10-325 MG PO TABS: 0.5000 | ORAL_TABLET | Freq: Four times a day (QID) | ORAL | Status: DC | PRN

## 2011-03-14 MED ORDER — ALPRAZOLAM 0.5 MG PO TABS: 0.5000 mg | ORAL_TABLET | Freq: Three times a day (TID) | ORAL | Status: DC | PRN

## 2011-03-14 MED ORDER — HYDROCODONE-ACETAMINOPHEN 10-325 MG PO TABS: 1.0000 | ORAL_TABLET | Freq: Three times a day (TID) | ORAL | Status: DC | PRN

## 2011-03-15 LAB — COMPLETE METABOLIC PANEL WITH GFR
AST: 19 U/L (ref 0–37)
Albumin: 4.9 g/dL (ref 3.5–5.2)
BUN: 10 mg/dL (ref 6–23)
CO2: 27 mEq/L (ref 19–32)
Calcium: 9.3 mg/dL (ref 8.4–10.5)
Chloride: 105 mEq/L (ref 96–112)
Creat: 0.77 mg/dL (ref 0.50–1.35)
GFR, Est African American: >60 mL/min (ref >60)
Potassium: 4.3 mEq/L (ref 3.5–5.3)

## 2011-03-15 LAB — URINALYSIS, ROUTINE W REFLEX MICROSCOPIC

## 2011-03-15 LAB — TESTOSTERONE, FREE, TOTAL, SHBG
Testosterone, Free: 58.9 pg/mL (ref 47.0–244.0)
Testosterone-% Free: 1.2 % — ABNORMAL LOW (ref 1.6–2.9)

## 2011-03-15 LAB — GC/CHLAMYDIA PROBE AMP, URINE
Chlamydia, Swab/Urine, PCR: NEGATIVE
GC Probe Amp, Urine: NEGATIVE

## 2011-03-15 LAB — T-HELPER CELL (CD4) - (RCID CLINIC ONLY): CD4 % Helper T Cell: 37 % (ref 33–55)

## 2011-03-27 ENCOUNTER — Inpatient Hospital Stay (INDEPENDENT_AMBULATORY_CARE_PROVIDER_SITE_OTHER)
Admission: RE | Admit: 2011-03-27 | Discharge: 2011-03-27 | Disposition: A | Discharge status: Home / Self Care | Payer: Self-pay | Source: Ambulatory Visit | Attending: Family Medicine | Admitting: Family Medicine

## 2011-03-27 DIAGNOSIS — J029 Acute pharyngitis, unspecified: Secondary | ICD-10-CM

## 2011-03-27 DIAGNOSIS — J069 Acute upper respiratory infection, unspecified: Secondary | ICD-10-CM

## 2011-03-27 LAB — POCT RAPID STREP A: Streptococcus, Group A Screen (Direct): NEGATIVE

## 2011-04-01 ENCOUNTER — Ambulatory Visit: Payer: Self-pay

## 2011-04-08 ENCOUNTER — Ambulatory Visit: Payer: Self-pay

## 2011-04-24 ENCOUNTER — Inpatient Hospital Stay (INDEPENDENT_AMBULATORY_CARE_PROVIDER_SITE_OTHER)
Admission: RE | Admit: 2011-04-24 | Discharge: 2011-04-24 | Disposition: A | Discharge status: Home / Self Care | Payer: Self-pay | Source: Ambulatory Visit | Attending: Family Medicine | Admitting: Family Medicine

## 2011-04-24 DIAGNOSIS — J019 Acute sinusitis, unspecified: Secondary | ICD-10-CM

## 2011-06-19 ENCOUNTER — Ambulatory Visit (INDEPENDENT_AMBULATORY_CARE_PROVIDER_SITE_OTHER): Payer: Self-pay

## 2011-06-19 ENCOUNTER — Inpatient Hospital Stay (INDEPENDENT_AMBULATORY_CARE_PROVIDER_SITE_OTHER)
Admission: RE | Admit: 2011-06-19 | Discharge: 2011-06-19 | Disposition: A | Discharge status: Home / Self Care | Payer: Self-pay | Source: Ambulatory Visit | Attending: Family Medicine | Admitting: Family Medicine

## 2011-06-19 DIAGNOSIS — J40 Bronchitis, not specified as acute or chronic: Secondary | ICD-10-CM

## 2011-06-19 DIAGNOSIS — J019 Acute sinusitis, unspecified: Secondary | ICD-10-CM

## 2011-06-20 ENCOUNTER — Telehealth: Payer: Self-pay | Admitting: *Deleted

## 2011-06-20 ENCOUNTER — Ambulatory Visit: Payer: Self-pay

## 2011-06-20 DIAGNOSIS — G4700 Insomnia, unspecified: Secondary | ICD-10-CM

## 2011-06-20 MED ORDER — DIPHENHYDRAMINE HCL 25 MG PO CAPS: 25.0000 mg | ORAL_CAPSULE | Freq: Every day | ORAL | Status: AC

## 2011-06-20 NOTE — Telephone Encounter (Signed)
Mother diagnosed with breast ca 06/17/11.  Pt has not slept well for the past 3 nights.  Came to Center for [facility]AP renewal and asked to speak with RN.  Requested that Dr. Maurice March be contacted to see about obtaining something to help him sleep.  Dr. Maurice March recommended Benadryl 25 mg capsule, 30 minutes prior to bedtime.  May repeat dose one time during the night.

## 2011-06-21 ENCOUNTER — Other Ambulatory Visit: Payer: Self-pay | Admitting: *Deleted

## 2011-06-21 DIAGNOSIS — B20 Human immunodeficiency virus [HIV] disease: Secondary | ICD-10-CM

## 2011-06-21 MED ORDER — EFAVIRENZ-EMTRICITAB-TENOFOVIR 600-200-300 MG PO TABS: 1.0000 | ORAL_TABLET | Freq: Every day | ORAL | Status: DC

## 2011-07-10 LAB — CBC
HCT: 38.8 — ABNORMAL LOW
Hemoglobin: 13.7
MCV: 96.3
RBC: 4.04 — ABNORMAL LOW
WBC: 8.1

## 2011-07-10 LAB — DIFFERENTIAL
Eosinophils Absolute: 0.3
Eosinophils Relative: 4
Lymphs Abs: 2.5
Monocytes Absolute: 0.6
Monocytes Relative: 7
Neutrophils Relative %: 58

## 2011-07-10 LAB — URINALYSIS, ROUTINE W REFLEX MICROSCOPIC
Glucose, UA: NEGATIVE
Hgb urine dipstick: NEGATIVE
pH: 7

## 2011-07-10 LAB — BASIC METABOLIC PANEL
CO2: 27
Chloride: 107
GFR calc Af Amer: >60
Potassium: 3.9
Sodium: 139

## 2011-07-24 ENCOUNTER — Telehealth: Payer: Self-pay | Admitting: *Deleted
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
## 2011-08-07 ENCOUNTER — Other Ambulatory Visit: Payer: Self-pay

## 2011-08-21 ENCOUNTER — Encounter: Payer: Self-pay | Admitting: Internal Medicine

## 2011-08-21 ENCOUNTER — Ambulatory Visit (INDEPENDENT_AMBULATORY_CARE_PROVIDER_SITE_OTHER): Payer: Self-pay | Admitting: Internal Medicine

## 2011-08-21 VITALS — BP 137/82 | HR 92 | Temp 98.2°F | Resp 16 | Wt 165.5 lb

## 2011-08-21 DIAGNOSIS — F411 Generalized anxiety disorder: Secondary | ICD-10-CM

## 2011-08-21 DIAGNOSIS — Z23 Encounter for immunization: Secondary | ICD-10-CM

## 2011-08-21 DIAGNOSIS — F419 Anxiety disorder, unspecified: Secondary | ICD-10-CM

## 2011-08-21 DIAGNOSIS — B20 Human immunodeficiency virus [HIV] disease: Secondary | ICD-10-CM

## 2011-08-21 DIAGNOSIS — Z79899 Other long term (current) drug therapy: Secondary | ICD-10-CM

## 2011-08-21 DIAGNOSIS — Z299 Encounter for prophylactic measures, unspecified: Secondary | ICD-10-CM

## 2011-08-21 DIAGNOSIS — Z113 Encounter for screening for infections with a predominantly sexual mode of transmission: Secondary | ICD-10-CM

## 2011-08-21 DIAGNOSIS — R52 Pain, unspecified: Secondary | ICD-10-CM

## 2011-08-21 MED ORDER — HYDROCODONE-ACETAMINOPHEN 10-325 MG PO TABS: 1.0000 | ORAL_TABLET | Freq: Three times a day (TID) | ORAL | Status: DC | PRN

## 2011-08-21 MED ORDER — ALPRAZOLAM 0.5 MG PO TABS: 0.5000 mg | ORAL_TABLET | Freq: Three times a day (TID) | ORAL | Status: DC | PRN

## 2011-08-21 NOTE — Patient Instructions (Signed)
Follow up in 6 months with Dr Lane

## 2011-08-21 NOTE — Assessment & Plan Note (Signed)
The patient continues to do well physical with his regimen and I encourage continued complaints. I did encourage condom use with all sexual activity. I also discussed smoking cessation. Additionally the patient did request continued Xanax in relation to his anxiety stemming from the loss of his grandparents earlier in the year as well as pain medications for back pain. I did discuss with him that I will alert his primary provider at his request and I did give him a 2 day supply of both Xanax and Norco. Otherwise he will return for follow up with his primary provider in 6 months

## 2011-08-21 NOTE — Progress Notes (Signed)
  Subjective:    Patient ID: Nicholas May, male    DOB: 1981/08/19, 30 y.o.   MRN: [id number]  HPI this patient comes in for routine followup of his HIV. He has congenitally acquired HIV and has been on medication for long time and had no significant issues. He continues to have good adherence and tolerance of his medication regimen and today has no complaints regards to his medication. He does however complain of what he describes fat wrist redistribution to his midsection and difficulty gaining weight and muscle on his extremities. He also complains of continued back pain and anxiety in relation to his recent loss of his grandparents.    Review of Systems  Constitutional: Negative for fever, chills and fatigue.  HENT: Negative for sore throat and trouble swallowing.   Respiratory: Negative for cough and shortness of breath.   Cardiovascular: Negative for leg swelling.  Gastrointestinal: Negative for nausea, diarrhea and constipation.  Genitourinary: Negative for urgency, frequency, hematuria, flank pain and genital sores.  Musculoskeletal: Negative for myalgias and arthralgias.  Skin: Negative for rash.  Neurological: Negative for headaches.  Hematological: Negative for adenopathy.  Psychiatric/Behavioral: Negative for dysphoric mood. The patient is nervous/anxious.        Objective:   Physical Exam  Constitutional: He is oriented to person, place, and time. He appears well-developed and well-nourished. No distress.  HENT:  Mouth/Throat: Oropharynx is clear and moist. No oropharyngeal exudate.  Cardiovascular: Normal rate, regular rhythm and normal heart sounds.  Exam reveals no gallop and no friction rub.   No murmur heard. Pulmonary/Chest: Effort normal and breath sounds normal. No respiratory distress. He has no wheezes.  Abdominal: Soft. Bowel sounds are normal. There is no tenderness. There is no rebound.  Lymphadenopathy:    He has no cervical adenopathy.  Neurological: He  is alert and oriented to person, place, and time.  Skin: Skin is warm and dry. No rash noted. No erythema.  Psychiatric: He has a normal mood and affect. His behavior is normal.          Assessment & Plan:

## 2011-08-23 ENCOUNTER — Telehealth: Payer: Self-pay | Admitting: Licensed Clinical Social Worker

## 2011-08-23 ENCOUNTER — Telehealth: Payer: Self-pay

## 2011-08-23 ENCOUNTER — Other Ambulatory Visit: Payer: Self-pay | Admitting: Licensed Clinical Social Worker

## 2011-08-23 DIAGNOSIS — F419 Anxiety disorder, unspecified: Secondary | ICD-10-CM

## 2011-08-23 DIAGNOSIS — B20 Human immunodeficiency virus [HIV] disease: Secondary | ICD-10-CM

## 2011-08-23 DIAGNOSIS — R52 Pain, unspecified: Secondary | ICD-10-CM

## 2011-08-23 MED ORDER — HYDROCODONE-ACETAMINOPHEN 10-325 MG PO TABS: 1.0000 | ORAL_TABLET | Freq: Three times a day (TID) | ORAL | Status: DC | PRN

## 2011-08-23 MED ORDER — ALPRAZOLAM 0.5 MG PO TABS: 0.5000 mg | ORAL_TABLET | Freq: Three times a day (TID) | ORAL | Status: DC | PRN

## 2011-08-23 MED ORDER — EFAVIRENZ-EMTRICITAB-TENOFOVIR 600-200-300 MG PO TABS: 1.0000 | ORAL_TABLET | Freq: Every day | ORAL | Status: DC

## 2011-08-23 NOTE — Telephone Encounter (Signed)
Per Dr. Maurice March

## 2011-08-23 NOTE — Telephone Encounter (Signed)
Pt concerned about continued back pain.  He would like to see a pain center since he feels this will be a lifetime condition.   I will forward this information to Dr Maurice March .  We will begin the  Referral . Laurell Josephs, RN

## 2011-09-09 NOTE — Telephone Encounter (Signed)
Dr. Maurice March will like to speak with this patient during his next visit and then a decision will be made about his medications.

## 2011-10-24 ENCOUNTER — Ambulatory Visit (INDEPENDENT_AMBULATORY_CARE_PROVIDER_SITE_OTHER): Payer: PRIVATE HEALTH INSURANCE | Admitting: Family Medicine

## 2011-10-24 VITALS — BP 126/75 | HR 92 | Temp 98.0°F | Resp 16 | Ht 69.75 in | Wt 166.2 lb

## 2011-10-24 DIAGNOSIS — L989 Disorder of the skin and subcutaneous tissue, unspecified: Secondary | ICD-10-CM

## 2011-10-24 DIAGNOSIS — R52 Pain, unspecified: Secondary | ICD-10-CM

## 2011-10-24 DIAGNOSIS — G8921 Chronic pain due to trauma: Secondary | ICD-10-CM

## 2011-10-24 DIAGNOSIS — F411 Generalized anxiety disorder: Secondary | ICD-10-CM

## 2011-10-24 DIAGNOSIS — F419 Anxiety disorder, unspecified: Secondary | ICD-10-CM

## 2011-10-24 MED ORDER — HYDROCODONE-ACETAMINOPHEN 10-325 MG PO TABS: 1.0000 | ORAL_TABLET | Freq: Three times a day (TID) | ORAL | Status: DC | PRN

## 2011-10-24 MED ORDER — ALPRAZOLAM 0.5 MG PO TABS: 0.5000 mg | ORAL_TABLET | Freq: Three times a day (TID) | ORAL | Status: DC | PRN

## 2011-10-24 NOTE — Progress Notes (Signed)
Patient Name: Nicholas May Date of Birth: 1981/02/27 Medical Record Number: [id number] Gender: male Date of Encounter: 10/24/2011  History of Present Illness:  Nicholas May is a 31 y.o. very pleasant male patient who presents with the following:  History of HIV.  His current MD (Dr. Maurice March) is retiring- he has been writing for pts medications. Per pt report Dr. Maurice March has done his norco and alprazolam as well- he will get a new doctor at [facility] but he needs someone to write for his other medications.  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  Had a revision at 31 years old.  Stable on his medications for several years.   Also is concerned about an area on his left nose- seems to be dilated blood vessels which are visible under the skin and bothers him.  He does have a dermatologist.    Reviewed controlled sub database- the patient does have some Rx from Dr. Maurice March but also several hydrocodone rx from a dentist.  Nicholas May reports he has a lot of oral surgery done last year but this is now complete and he does not anticipate getting any further pain meds from his dentist.    Patient Active Problem List  Diagnoses  . HIV DISEASE  . ANXIETY  . DISORDER, SKIN NEC  . BACK PAIN   Past Medical History  Diagnosis Date  . Anxiety   . HIV infection    No past surgical history on file. History  Substance Use Topics  . Smoking status: Current Everyday Smoker -- 0.5 packs/day    Types: Cigarettes  . Smokeless tobacco: Never Used   Comment: in process of qutting  . Alcohol Use: No   No family history on file. Allergies  Allergen Reactions  . Penicillins     Medication list has been reviewed and updated.  Review of Systems: As per HPI- otherwise well  Physical Examination: Filed Vitals:   10/24/11 1738  BP: 126/75  Pulse: 92  Temp: 98 F (36.7 C)  TempSrc: Oral  Resp: 16  Height: 5' 9.75" (1.772 m)    Weight: 166 lb 3.2 oz (75.388 kg)    Body mass index is 24.02 kg/(m^2).   GEN: WDWN, NAD, Non-toxic, Alert & Oriented x 3 HEENT: Atraumatic, Normocephalic.  Ears and Nose: No external deformity.  Small area of dilated blood vessels under skin of left bridge of nose EXTR: No clubbing/cyanosis/edema NEURO: Normal gait.  PSYCH: Normally interactive. Conversant. Not depressed or anxious appearing.  Calm demeanor.  Back: surgical scar runs from lower thoracic into lumbar spine   Assessment and Plan: 1. Lesion of skin of face    2. Chronic pain due to injury    3. Anxiety  ALPRAZolam (XANAX) 0.5 MG tablet  4. Pain  HYDROcodone-acetaminophen (NORCO) 10-325 MG per tablet   Patient would like for [place] to take over prescribing his chronic xanax and hydrocodone.  He requested #90 of each with 6 refills. I have reviewed his controlled substance database report and am not comfortable prescribing this amount due to the presence of 2 different main providers and a history of frequent refills of small amounts of hydrocodone.  I will give him a one month supply of his xanax with one refill, and one month only of his norco for now.  If he is able to establish a good pattern we may be able to extend his number  of refills.    Gave xanax 0.5 #90 1RF norco 10 #90 no RF Nicholas May was a little frustrated that we could not give more refills, but understood the reason for caution as he is a new patient to our clinic.  I did let him know that if he shows a reassuring pattern of only receiving medications from our clinic when we next review the database we can extend his number of refills.    He plans to continue to follow- up with [facility]  He will schedule an appointment with his dermatologist to evaluate the concerning area on his nose.

## 2011-11-18 ENCOUNTER — Telehealth: Payer: Self-pay

## 2011-11-18 DIAGNOSIS — G8929 Other chronic pain: Secondary | ICD-10-CM

## 2011-11-18 NOTE — Telephone Encounter (Signed)
Pt of Dr. Patsy Lager is needing someone to contact him he states he is having uncontrollabe diarrhea please contact

## 2011-11-19 NOTE — Telephone Encounter (Signed)
Left message to CB

## 2011-11-20 NOTE — Telephone Encounter (Signed)
Pt states his pharmacy contacted [place] and has sent multiple requests over the past few days for his Norco. [facility] [address]  Pt best: [phone]  bf

## 2011-11-21 ENCOUNTER — Telehealth: Payer: Self-pay | Admitting: Physician Assistant

## 2011-11-21 DIAGNOSIS — R52 Pain, unspecified: Secondary | ICD-10-CM

## 2011-11-21 MED ORDER — HYDROCODONE-ACETAMINOPHEN 10-325 MG PO TABS: 1.0000 | ORAL_TABLET | Freq: Three times a day (TID) | ORAL | Status: DC | PRN

## 2011-11-21 NOTE — Telephone Encounter (Signed)
Pt called back again to check on his Norco Rx, and I asked him about his diarrhea that he had called about before. Pt stated that he took OTC immodium and it has resolved.

## 2011-11-21 NOTE — Telephone Encounter (Signed)
Reviewed controlled database - last filled 10/24/2011.  Will RF today.  Printed and signed at [employer].

## 2011-11-21 NOTE — Telephone Encounter (Signed)
See pt request for Norco RF. I tried to call pt back to see if he is still having diarrhea, as he didn't mention it when he returned our previous call. Did not get an answer, but didn't want to hold up his RF request while waiting for another CB from pt.

## 2011-11-22 NOTE — Telephone Encounter (Signed)
GAVE PT MESSAGE

## 2011-12-11 ENCOUNTER — Other Ambulatory Visit: Payer: Self-pay | Admitting: *Deleted

## 2011-12-11 DIAGNOSIS — F419 Anxiety disorder, unspecified: Secondary | ICD-10-CM

## 2011-12-11 DIAGNOSIS — R52 Pain, unspecified: Secondary | ICD-10-CM

## 2011-12-13 ENCOUNTER — Telehealth: Payer: Self-pay | Admitting: Internal Medicine

## 2011-12-13 NOTE — Telephone Encounter (Signed)
Request for hydrocodone refilled, last fill 0

## 2011-12-14 ENCOUNTER — Other Ambulatory Visit: Payer: Self-pay | Admitting: *Deleted

## 2011-12-14 DIAGNOSIS — F419 Anxiety disorder, unspecified: Secondary | ICD-10-CM

## 2011-12-14 MED ORDER — ALPRAZOLAM 0.5 MG PO TABS: 0.5000 mg | ORAL_TABLET | Freq: Three times a day (TID) | ORAL | Status: DC | PRN

## 2011-12-17 ENCOUNTER — Ambulatory Visit (INDEPENDENT_AMBULATORY_CARE_PROVIDER_SITE_OTHER): Payer: PRIVATE HEALTH INSURANCE | Admitting: Family Medicine

## 2011-12-17 VITALS — BP 113/71 | HR 89 | Temp 98.2°F | Resp 16 | Ht 71.0 in | Wt 165.0 lb

## 2011-12-17 DIAGNOSIS — F411 Generalized anxiety disorder: Secondary | ICD-10-CM

## 2011-12-17 DIAGNOSIS — R52 Pain, unspecified: Secondary | ICD-10-CM

## 2011-12-17 DIAGNOSIS — J029 Acute pharyngitis, unspecified: Secondary | ICD-10-CM

## 2011-12-17 DIAGNOSIS — R109 Unspecified abdominal pain: Secondary | ICD-10-CM

## 2011-12-17 DIAGNOSIS — R1030 Lower abdominal pain, unspecified: Secondary | ICD-10-CM

## 2011-12-17 DIAGNOSIS — F419 Anxiety disorder, unspecified: Secondary | ICD-10-CM

## 2011-12-17 DIAGNOSIS — G8929 Other chronic pain: Secondary | ICD-10-CM

## 2011-12-17 MED ORDER — ALPRAZOLAM 0.5 MG PO TABS: 0.5000 mg | ORAL_TABLET | Freq: Three times a day (TID) | ORAL | Status: DC | PRN

## 2011-12-17 MED ORDER — HYDROCODONE-ACETAMINOPHEN 10-325 MG PO TABS: 1.0000 | ORAL_TABLET | Freq: Three times a day (TID) | ORAL | Status: DC | PRN

## 2011-12-17 MED ORDER — CLINDAMYCIN HCL 300 MG PO CAPS: 300.0000 mg | ORAL_CAPSULE | Freq: Three times a day (TID) | ORAL | Status: AC

## 2011-12-17 NOTE — Progress Notes (Signed)
Patient Name: Nicholas May Date of Birth: 31-Aug-1981 Medical Record Number: [id number] Gender: male Date of Encounter: 12/17/2011  History of Present Illness:  Nicholas May is a 31 y.o. very pleasant male patient who presents with the following:  This am noted a ST and he is not feeling that well.  He has no fever.  ST is worse on the left.  Brother in law is also ill with a ST- however he has had a ST for a month and it seems to be due to allergies.  He has no cough or sneeze.  No GI symptoms.  He also needs a refill of norco and xanax which he uses for chronic pain and anxiety.  He also notes that he has had a painful area on his right perineum for about one year- he has been told it was due to an "ingrown hair" in the past- it has never been I and D'd but he has drained it himself a couple of times and gotten out "green stuff.'  It is currently inflamed and painful again.  It seems to get worse intermittently when his underwear rubs on it.    Patient Active Problem List  Diagnoses  . HIV DISEASE  . ANXIETY  . DISORDER, SKIN NEC  . BACK PAIN   Past Medical History  Diagnosis Date  . Anxiety   . HIV infection    No past surgical history on file. History  Substance Use Topics  . Smoking status: Current Everyday Smoker -- 0.5 packs/day    Types: Cigarettes  . Smokeless tobacco: Never Used   Comment: in process of qutting  . Alcohol Use: No   No family history on file. Allergies  Allergen Reactions  . Penicillins    Medication list has been reviewed and updated.  Review of Systems: As per HPI- otherwise negative. Of note no fever He has had his tonsils removed  Physical Examination: Filed Vitals:   12/17/11 1346  BP: 113/71  Pulse: 89  Temp: 98.2 F (36.8 C)  TempSrc: Oral  Resp: 16  Height: 5\' 11"  (1.803 m)  Weight: 165 lb (74.844 kg)   Body mass index is 23.01 kg/(m^2).  GEN: WDWN, NAD, Non-toxic, A & O x 3, slim build HEENT: Atraumatic,  Normocephalic. Neck supple. No masses, No LAD.  TM, oropharynx wnl.  Throat looks benign Ears and Nose: No external deformity. CV: RRR, No M/G/R. No JVD. No thrill. No extra heart sounds. PULM: CTA B, no wheezes, crackles, rhonchi. No retractions. No resp. distress. No accessory muscle use. ABD: S, NT, ND, +BS. No rebound. No HSM. EXTR: No c/c/e NEURO Normal gait.  PSYCH: Normally interactive. Conversant. Not depressed or anxious appearing.  Calm demeanor.  GU: patient has a swollen and tender are located adjacent to the right side of his scrotum.  This is not part of the scrotum, no redness, no fluctuance.    Results for orders placed in visit on 12/17/11  POCT RAPID STREP A (OFFICE)      Component Value Range   Rapid Strep A Screen Negative  Negative    Assessment and Plan: 1. Sore throat  POCT rapid strep A, Throat culture ([facility])  2. Chronic pain    3. Anxiety  ALPRAZolam (XANAX) 0.5 MG tablet  4. Pain in the groin  clindamycin (CLEOCIN) 300 MG capsule, Ambulatory referral to Urology  5. Pain  HYDROcodone-acetaminophen (NORCO) 10-325 MG per tablet   Reassured patient that I do not think he  has a bacterial ST at this time- he has had success with benadryl in the past and plans to continue to use this.  Await throat culture- let me know if he gets worse or has a fever in the meantime.   Clindamycin for groin ?infection- referral to urology for further evaluation.  Due to location will not attempt to I and D here

## 2011-12-19 LAB — CULTURE, GROUP A STREP: Organism ID, Bacteria: NORMAL

## 2011-12-20 ENCOUNTER — Encounter: Payer: Self-pay | Admitting: Family Medicine

## 2011-12-23 ENCOUNTER — Telehealth: Payer: Self-pay | Admitting: *Deleted

## 2011-12-23 NOTE — Telephone Encounter (Signed)
He came in with a DMV form to be signed for a renewal of his permanent handicapped tag. I spoke with md who confirmed this was ok. Pt asked that I sign it as md will not be here until Thursday. I told him the DMV clearly asked for md signature. He states they have accepted nurse's signatures before. I signed it & told him md will be here Thursday if they reject the form

## 2012-01-12 NOTE — Telephone Encounter (Signed)
[facility] to report patient is seeking a refill on his narcotics one week early (which he has done repeatedly).  They also report that more than one office is writing narcotic prescriptions for him, but that they do not have a complete list of his prescribers.  [facility] told [facility] to [facility] all narcotic prescriptions from our office until further notice.

## 2012-01-13 NOTE — Telephone Encounter (Signed)
Called back- I have been able to review his controlled sub database report which shows that he is refilling his medications a few days early every month, but does not show other providers (suspect other rx from doctor who "lanced a boil" for him a few days ago has not shown up yet.)    Discussed his early refills and see what is going on there. Nicholas May states that he was "running out early" this month due to his boil.  However, I reminded him that he had a 30 day supply given on 3/26 AND he received more medication a few days ago by someone else.  I am willing to authorize one more RF of his norco, but then he will need to get established with pain management.  He also might consider entering a treatment program to try and stop his chronic narcotic use- he is only 31 years old and is on hydrocodone 10mg  TID.  He is not interested in trying to get off medications at this time

## 2012-01-13 NOTE — Telephone Encounter (Signed)
DEA report given to Dr. Patsy Lager for review today 01/13/2012

## 2012-01-13 NOTE — Telephone Encounter (Signed)
Patient called again last night wanting me personally to authorize his early refill and reinstate his other refills.  [facility] he recently received a small supply of narcotics from another office.  Declined to reinstate his refills until we have a full DEA report.    Please pull his most recent controlled substance report- thanks!  I will review when I get to office at 2pm

## 2012-06-19 ENCOUNTER — Other Ambulatory Visit: Payer: Self-pay | Admitting: *Deleted

## 2012-06-19 DIAGNOSIS — R52 Pain, unspecified: Secondary | ICD-10-CM

## 2012-06-19 MED ORDER — HYDROCODONE-ACETAMINOPHEN 10-325 MG PO TABS: 1.0000 | ORAL_TABLET | Freq: Four times a day (QID) | ORAL | Status: DC | PRN

## 2012-06-19 NOTE — Telephone Encounter (Signed)
Refilled done per verbal order from Dr Maurice March.

## 2012-07-11 ENCOUNTER — Encounter: Payer: Self-pay | Admitting: Family Medicine

## 2012-07-11 DIAGNOSIS — G8929 Other chronic pain: Secondary | ICD-10-CM | POA: Insufficient documentation

## 2012-11-19 ENCOUNTER — Telehealth: Payer: Self-pay | Admitting: *Deleted

## 2012-11-19 NOTE — Telephone Encounter (Signed)
Called patient to try and get him an appt with Dr Maurice March asap. Had to leave a message for him to call the office to do so soon.

## 2013-01-26 ENCOUNTER — Other Ambulatory Visit: Payer: Self-pay | Admitting: Infectious Diseases

## 2013-02-17 ENCOUNTER — Telehealth: Payer: Self-pay | Admitting: *Deleted

## 2013-02-17 ENCOUNTER — Other Ambulatory Visit: Payer: Self-pay | Admitting: *Deleted

## 2013-02-17 DIAGNOSIS — B20 Human immunodeficiency virus [HIV] disease: Secondary | ICD-10-CM

## 2013-02-17 DIAGNOSIS — F419 Anxiety disorder, unspecified: Secondary | ICD-10-CM

## 2013-02-17 MED ORDER — ALPRAZOLAM 1 MG PO TABS: 1.0000 mg | ORAL_TABLET | Freq: Every evening | ORAL | Status: DC | PRN

## 2013-02-17 MED ORDER — EFAVIRENZ-EMTRICITAB-TENOFOVIR 600-200-300 MG PO TABS: 1.0000 | ORAL_TABLET | Freq: Every day | ORAL | Status: DC

## 2013-02-17 NOTE — Telephone Encounter (Signed)
Patient called and advised he needs refills on his Xanax and Atripla. Advised him he has not been seen her since 2012 and will need to make an appt asap. Dr Maurice March called and advised to authorize 1 refill on Xanax and Atripla so I did. I did advise the patient he will need to be seen in clinic going forward in order to get any further refills. He advised he understands but he started a new job recently and can not get off of work advised him understand but we need labs and OV in order to refill any future Rx and he will need to make time to be seen we can do labs and OV in same day if necessary. He advised he will call back soon he has to check his schedule.

## 2013-02-26 ENCOUNTER — Telehealth: Payer: Self-pay | Admitting: Licensed Clinical Social Worker

## 2013-02-26 NOTE — Telephone Encounter (Signed)
Patient stating that he talked to Dr. Maurice March and he was told to call here so we could call  In 6 months supply [facility] I tried to contact the patient back to let him know that Dr. Maurice March was out of town and not reachable by phone for me to verify his request and I would not be calling in his prescription.

## 2013-03-23 ENCOUNTER — Telehealth: Payer: Self-pay | Admitting: Licensed Clinical Social Worker

## 2013-03-23 ENCOUNTER — Encounter: Payer: Self-pay | Admitting: Licensed Clinical Social Worker

## 2013-03-23 NOTE — Telephone Encounter (Signed)
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 When he calls back I need to know what pharmacy to call his medication to. This is the only refill he will get on Norco and he will need to make an appointment for his care.

## 2013-03-23 NOTE — Progress Notes (Signed)
Patient called Dr Maurice March on his personal cell for Norco refills.  He has called our office several times stating Dr Maurice March gave permission  for our office to call in medication which we refused.   I spoke with Dr Maurice March today  and notified him the patient has not been seen in our office for two years . [facility] which showed original script given April 2014 with 2 refills.  Last refill given 03-05-13 for #120.   Dr Maurice March has given permission for patient to have a one time refill of Norco 10/325 # 120 .   I will call the patient to advise him this is the last refill from the facility.  He can schedule an office visit with one of our physicians to determine if pain medications will be continued.  Technically he is no longer in care since there has not been  a visit within 2 years .  He has not had follow up for HIV.  I will document this in patients chart.    Laurell Josephs, RN

## 2013-03-24 ENCOUNTER — Other Ambulatory Visit: Payer: Self-pay | Admitting: Licensed Clinical Social Worker

## 2013-03-24 DIAGNOSIS — R52 Pain, unspecified: Secondary | ICD-10-CM

## 2013-03-24 MED ORDER — HYDROCODONE-ACETAMINOPHEN 10-325 MG PO TABS: 1.0000 | ORAL_TABLET | Freq: Four times a day (QID) | ORAL | Status: DC | PRN

## 2013-03-31 ENCOUNTER — Other Ambulatory Visit: Payer: Self-pay

## 2013-04-14 ENCOUNTER — Telehealth: Payer: Self-pay | Admitting: *Deleted

## 2013-04-14 ENCOUNTER — Ambulatory Visit: Payer: Self-pay | Admitting: Internal Medicine

## 2013-04-14 NOTE — Telephone Encounter (Signed)
Called patient to try and reschedule his missed appt but had to leave a message on his voice mail.

## 2013-05-06 ENCOUNTER — Ambulatory Visit (INDEPENDENT_AMBULATORY_CARE_PROVIDER_SITE_OTHER): Payer: BC Managed Care – PPO | Admitting: General Practice

## 2013-05-06 ENCOUNTER — Telehealth: Payer: Self-pay | Admitting: Nurse Practitioner

## 2013-05-06 ENCOUNTER — Encounter: Payer: Self-pay | Admitting: General Practice

## 2013-05-06 VITALS — BP 124/78 | HR 102 | Temp 99.2°F | Ht 71.0 in | Wt 167.0 lb

## 2013-05-06 DIAGNOSIS — B20 Human immunodeficiency virus [HIV] disease: Secondary | ICD-10-CM

## 2013-05-06 DIAGNOSIS — R7989 Other specified abnormal findings of blood chemistry: Secondary | ICD-10-CM

## 2013-05-06 DIAGNOSIS — R52 Pain, unspecified: Secondary | ICD-10-CM

## 2013-05-06 DIAGNOSIS — G4700 Insomnia, unspecified: Secondary | ICD-10-CM

## 2013-05-06 DIAGNOSIS — G8929 Other chronic pain: Secondary | ICD-10-CM

## 2013-05-06 DIAGNOSIS — E291 Testicular hypofunction: Secondary | ICD-10-CM

## 2013-05-06 DIAGNOSIS — M549 Dorsalgia, unspecified: Secondary | ICD-10-CM

## 2013-05-06 MED ORDER — HYDROCODONE-ACETAMINOPHEN 10-325 MG PO TABS: 1.0000 | ORAL_TABLET | Freq: Four times a day (QID) | ORAL | Status: DC | PRN

## 2013-05-06 MED ORDER — ESZOPICLONE 3 MG PO TABS: 3.0000 mg | ORAL_TABLET | Freq: Every day | ORAL | Status: DC

## 2013-05-06 NOTE — Patient Instructions (Addendum)

## 2013-05-06 NOTE — Progress Notes (Signed)
  Subjective:    Patient ID: Nicholas May, male    DOB: 22-Dec-1980, 32 y.o.   MRN: [id number]  HPI Patient presents today to establish care. He reports being seen by Dr. Maurice March who has recently retired. He reports taking xanax and would like to try something different. He reports the xanax makes him feel "loopy" in the am and has taken lunesta in the past which was effective. History of chronic back pain (due to back fracture at age 77) and norco is effective. Reports history of low testosterone and would like level added to upcoming labs.     Review of Systems  Constitutional: Negative for fever and chills.  Eyes: Negative for pain.  Respiratory: Negative for chest tightness and shortness of breath.   Cardiovascular: Negative for chest pain and palpitations.  Gastrointestinal: Negative for nausea, vomiting, abdominal pain and blood in stool.  Genitourinary: Negative for difficulty urinating.  Musculoskeletal: Positive for back pain.  Neurological: Negative for dizziness, weakness and headaches.       Objective:   Physical Exam  Constitutional: He is oriented to person, place, and time. He appears well-developed and well-nourished.  HENT:  Head: Normocephalic and atraumatic.  Right Ear: External ear normal.  Left Ear: External ear normal.  Nose: Nose normal.  Mouth/Throat: Oropharynx is clear and moist.  Eyes: Conjunctivae and EOM are normal. Pupils are equal, round, and reactive to light.  Neck: Normal range of motion. Neck supple. No thyromegaly present.  Cardiovascular: Normal rate, regular rhythm and normal heart sounds.   Pulmonary/Chest: Effort normal and breath sounds normal. No respiratory distress. He exhibits no tenderness.  Abdominal: Soft. Bowel sounds are normal. He exhibits no distension. There is no tenderness.  Musculoskeletal: Normal range of motion.  Lymphadenopathy:    He has no cervical adenopathy.  Neurological: He is alert and oriented to person, place, and  time.  Skin: Skin is warm and dry.  Psychiatric: He has a normal mood and affect.          Assessment & Plan:  1. Insomnia - Eszopiclone (ESZOPICLONE) 3 MG TABS; Take 1 tablet (3 mg total) by mouth at bedtime. Take immediately before bedtime  Dispense: 30 tablet; Refill: 3 -discussed side effects  2. Pain  3. Chronic back pain - HYDROcodone-acetaminophen (NORCO) 10-325 MG per tablet; Take 1 tablet by mouth every 6 (six) hours as needed for pain.  Dispense: 120 tablet; Refill: 0  4. Low testosterone - Testosterone,Free and Total; Future  5. Human immunodeficiency virus (HIV) disease -will contact patient's previous provider's office (Dr. Lina Sayre [phone]) to discuss how to best manage patient's care.  -referral may be made to a infectious disease office -RTO in 3 months for follow up -Patient verbalized understanding Coralie Keens, FNP-C

## 2013-05-06 NOTE — Telephone Encounter (Signed)
Appt scheduled

## 2013-05-07 ENCOUNTER — Other Ambulatory Visit: Payer: BC Managed Care – PPO

## 2013-05-07 ENCOUNTER — Telehealth: Payer: Self-pay | Admitting: General Practice

## 2013-05-10 NOTE — Telephone Encounter (Signed)
INFORMATION GIVEN TO DONNA LAWSON AND SHE WILL CHECK ON MED WITH [facility].

## 2013-05-28 ENCOUNTER — Telehealth: Payer: Self-pay | Admitting: *Deleted

## 2013-05-28 NOTE — Telephone Encounter (Signed)
Pt is requesting rf on hydrocodone - last written 05/06/13 Please let pt know when to pick up

## 2013-06-04 ENCOUNTER — Other Ambulatory Visit: Payer: Self-pay | Admitting: *Deleted

## 2013-06-04 DIAGNOSIS — G8929 Other chronic pain: Secondary | ICD-10-CM

## 2013-06-04 MED ORDER — HYDROCODONE-ACETAMINOPHEN 10-325 MG PO TABS: 1.0000 | ORAL_TABLET | Freq: Four times a day (QID) | ORAL | Status: DC | PRN

## 2013-06-04 NOTE — Telephone Encounter (Signed)
Patient last seen in office on 05-06-13. Rx last filled on 05-06-13 for #120. Please review refill history and notes about pharmacy. If approved please print and route to Pool B so nurse can call patient to come and pick up rx

## 2013-06-06 NOTE — Telephone Encounter (Signed)
Script given to patient in the office.

## 2013-07-01 ENCOUNTER — Other Ambulatory Visit: Payer: Self-pay | Admitting: General Practice

## 2013-07-01 DIAGNOSIS — J302 Other seasonal allergic rhinitis: Secondary | ICD-10-CM

## 2013-07-01 DIAGNOSIS — G8929 Other chronic pain: Secondary | ICD-10-CM

## 2013-07-01 MED ORDER — HYDROCODONE-ACETAMINOPHEN 10-325 MG PO TABS: 1.0000 | ORAL_TABLET | Freq: Four times a day (QID) | ORAL | Status: DC | PRN

## 2013-07-01 MED ORDER — FLUTICASONE PROPIONATE 50 MCG/ACT NA SUSP: 2.0000 | Freq: Every day | NASAL | Status: DC

## 2013-07-20 ENCOUNTER — Telehealth: Payer: Self-pay | Admitting: *Deleted

## 2013-07-20 ENCOUNTER — Other Ambulatory Visit: Payer: Self-pay | Admitting: General Practice

## 2013-07-20 DIAGNOSIS — G8929 Other chronic pain: Secondary | ICD-10-CM

## 2013-07-20 DIAGNOSIS — R7989 Other specified abnormal findings of blood chemistry: Secondary | ICD-10-CM

## 2013-07-20 MED ORDER — HYDROCODONE-ACETAMINOPHEN 10-325 MG PO TABS: 1.0000 | ORAL_TABLET | Freq: Four times a day (QID) | ORAL | Status: DC | PRN

## 2013-07-20 NOTE — Progress Notes (Signed)
Patient reports generalized fatigued and lack of sex drive. He reports experiencing these symptoms in the past when testosterone was low. Would like to start testosterone injections, if possible.

## 2013-07-20 NOTE — Telephone Encounter (Signed)
Patient has insurance now and would like to start back on the androgel.  [facility]   apply once daily.

## 2013-07-21 ENCOUNTER — Other Ambulatory Visit: Payer: Self-pay | Admitting: General Practice

## 2013-07-21 ENCOUNTER — Other Ambulatory Visit (INDEPENDENT_AMBULATORY_CARE_PROVIDER_SITE_OTHER): Payer: BC Managed Care – PPO

## 2013-07-21 ENCOUNTER — Encounter (INDEPENDENT_AMBULATORY_CARE_PROVIDER_SITE_OTHER): Payer: Self-pay

## 2013-07-21 DIAGNOSIS — R7989 Other specified abnormal findings of blood chemistry: Secondary | ICD-10-CM

## 2013-07-21 DIAGNOSIS — Z229 Carrier of infectious disease, unspecified: Secondary | ICD-10-CM

## 2013-07-21 DIAGNOSIS — E291 Testicular hypofunction: Secondary | ICD-10-CM

## 2013-07-21 NOTE — Progress Notes (Signed)
Labs only per [employer]

## 2013-07-21 NOTE — Telephone Encounter (Signed)
Patient came by office for rx.

## 2013-07-21 NOTE — Telephone Encounter (Signed)
Spoke with patient and informed PSA and testosterone level needs to be drawn for evaluation. He verbalized understanding and in agreement.

## 2013-07-23 LAB — TESTOSTERONE,FREE AND TOTAL

## 2013-07-26 ENCOUNTER — Telehealth: Payer: Self-pay | Admitting: General Practice

## 2013-07-26 NOTE — Telephone Encounter (Signed)
Spoke with patient and informed of testosterone levels. Also informed patient that he could be referred to a urologist, who could address and manage testosterone, if needed. Patient declined the referral at this time. Also informed patient that an infectious disease referral has been made to manage his care. Patient verbalized understanding.

## 2013-08-11 ENCOUNTER — Ambulatory Visit: Payer: Self-pay | Admitting: Internal Medicine

## 2013-08-24 ENCOUNTER — Other Ambulatory Visit: Payer: Self-pay | Admitting: General Practice

## 2013-08-24 ENCOUNTER — Telehealth: Payer: Self-pay | Admitting: General Practice

## 2013-08-24 DIAGNOSIS — G8929 Other chronic pain: Secondary | ICD-10-CM

## 2013-08-24 MED ORDER — HYDROCODONE-ACETAMINOPHEN 10-325 MG PO TABS: 1.0000 | ORAL_TABLET | Freq: Four times a day (QID) | ORAL | Status: DC | PRN

## 2013-08-24 NOTE — Telephone Encounter (Signed)
Patient received a prescription (hydrocodone 10-325) from this office and took to [facility] pharmacy, provider was notified by pharmacist that patient has been prescribed Suboxone and buprenorphine on 11/19 and 11/24. Pharmacist verbalized wanting to make me aware, prescription was cancelled. Spoke with patient about hydrocodone prescription (that he picked up from office this evening) and he informed that he is no longer taking hydrocodone 10-325. Verbalized he was unaware of what prescription that was received from office. Reports he is being treated at [facility], in [place]. Provider informed patient that narcotics or benzodiazepines would not be written by this office. Patient verbalized understanding.

## 2013-10-11 ENCOUNTER — Ambulatory Visit: Payer: Self-pay

## 2013-10-12 ENCOUNTER — Ambulatory Visit: Payer: Self-pay

## 2013-11-22 ENCOUNTER — Other Ambulatory Visit: Payer: Self-pay | Admitting: *Deleted

## 2013-11-22 ENCOUNTER — Other Ambulatory Visit: Payer: BC Managed Care – PPO

## 2013-11-22 ENCOUNTER — Telehealth: Payer: Self-pay | Admitting: General Practice

## 2013-11-22 ENCOUNTER — Telehealth: Payer: Self-pay | Admitting: Family Medicine

## 2013-11-22 DIAGNOSIS — B20 Human immunodeficiency virus [HIV] disease: Secondary | ICD-10-CM

## 2013-11-22 DIAGNOSIS — Z79899 Other long term (current) drug therapy: Secondary | ICD-10-CM

## 2013-11-22 DIAGNOSIS — Z113 Encounter for screening for infections with a predominantly sexual mode of transmission: Secondary | ICD-10-CM

## 2013-11-22 LAB — COMPREHENSIVE METABOLIC PANEL
ALK PHOS: 55 U/L (ref 39–117)
ALT: 25 U/L (ref 0–53)
AST: 25 U/L (ref 0–37)
Albumin: 4.5 g/dL (ref 3.5–5.2)
BUN: 10 mg/dL (ref 6–23)
CO2: 29 mEq/L (ref 19–32)
Calcium: 9.3 mg/dL (ref 8.4–10.5)
Chloride: 102 mEq/L (ref 96–112)
Creat: 0.73 mg/dL (ref 0.50–1.35)
Glucose, Bld: 87 mg/dL (ref 70–99)
Potassium: 4.3 mEq/L (ref 3.5–5.3)
SODIUM: 138 meq/L (ref 135–145)
Total Bilirubin: 0.5 mg/dL (ref 0.2–1.2)
Total Protein: 8.3 g/dL (ref 6.0–8.3)

## 2013-11-22 LAB — CBC WITH DIFFERENTIAL/PLATELET
BASOS PCT: 0 % (ref 0–1)
Basophils Absolute: 0 10*3/uL (ref 0.0–0.1)
EOS ABS: 0.2 10*3/uL (ref 0.0–0.7)
Eosinophils Relative: 2 % (ref 0–5)
HCT: 39.6 % (ref 39.0–52.0)
Hemoglobin: 13.8 g/dL (ref 13.0–17.0)
Lymphocytes Relative: 45 % (ref 12–46)
Lymphs Abs: 3.6 10*3/uL (ref 0.7–4.0)
MCH: 30.3 pg (ref 26.0–34.0)
MCHC: 34.8 g/dL (ref 30.0–36.0)
MCV: 87 fL (ref 78.0–100.0)
Monocytes Absolute: 0.6 10*3/uL (ref 0.1–1.0)
Monocytes Relative: 7 % (ref 3–12)
NEUTROS ABS: 3.7 10*3/uL (ref 1.7–7.7)
NEUTROS PCT: 46 % (ref 43–77)
Platelets: 257 10*3/uL (ref 150–400)
RBC: 4.55 MIL/uL (ref 4.22–5.81)
RDW: 13.2 % (ref 11.5–15.5)
WBC: 8 10*3/uL (ref 4.0–10.5)

## 2013-11-22 LAB — LIPID PANEL
CHOLESTEROL: 166 mg/dL (ref 0–200)
HDL: 29 mg/dL — ABNORMAL LOW (ref >39)
LDL Cholesterol: 122 mg/dL — ABNORMAL HIGH (ref 0–99)
TRIGLYCERIDES: 73 mg/dL (ref <150)
Total CHOL/HDL Ratio: 5.7 Ratio
VLDL: 15 mg/dL (ref 0–40)

## 2013-11-22 MED ORDER — EFAVIRENZ-EMTRICITAB-TENOFOVIR 600-200-300 MG PO TABS: 1.0000 | ORAL_TABLET | Freq: Every day | ORAL | Status: DC

## 2013-11-22 NOTE — Telephone Encounter (Signed)
Dr. Maurice MarchLane has retired.  Pt now scheduled with Dr. Staci Righterobert Comer.  Called Atripla refill for # 30 w/ 2 refills until the pt is seen by Dr. Luciana Axeomer.  Pt has new health insurance.  Will present card when picking up rx.

## 2013-11-22 NOTE — Telephone Encounter (Signed)
She will shots are not recommended until the age of 32. One does he want a shingle [facility] and as he realized how much is going to cost

## 2013-11-22 NOTE — Telephone Encounter (Signed)
Cannot get zostavax under the age of 32 years

## 2013-11-22 NOTE — Telephone Encounter (Signed)
Patient aware.

## 2013-11-22 NOTE — Telephone Encounter (Signed)
Patient didn't want to wait for mae to come back and do. He asked if you would write it and send it over for him?

## 2013-11-22 NOTE — Telephone Encounter (Signed)
Please advise mae you patient .

## 2013-11-22 NOTE — Telephone Encounter (Signed)
error 

## 2013-11-23 LAB — URINALYSIS, ROUTINE W REFLEX MICROSCOPIC
Bilirubin Urine: NEGATIVE
Glucose, UA: NEGATIVE mg/dL
HGB URINE DIPSTICK: NEGATIVE
Ketones, ur: NEGATIVE mg/dL
LEUKOCYTES UA: NEGATIVE
NITRITE: NEGATIVE
PH: 6 (ref 5.0–8.0)
Protein, ur: NEGATIVE mg/dL
SPECIFIC GRAVITY, URINE: 1.023 (ref 1.005–1.030)
Urobilinogen, UA: 1 mg/dL (ref 0.0–1.0)

## 2013-11-23 LAB — RPR

## 2013-11-23 LAB — URINE CYTOLOGY ANCILLARY ONLY
CHLAMYDIA, DNA PROBE: NEGATIVE
Neisseria Gonorrhea: NEGATIVE

## 2013-11-23 LAB — T-HELPER CELL (CD4) - (RCID CLINIC ONLY)
CD4 T CELL HELPER: 13 % — AB (ref 33–55)
CD4 T Cell Abs: 530 /uL (ref 400–2700)

## 2013-11-24 LAB — HIV-1 RNA QUANT-NO REFLEX-BLD
HIV 1 RNA Quant: 5147 copies/mL — ABNORMAL HIGH (ref <20)
HIV-1 RNA QUANT, LOG: 3.71 {Log} — AB (ref <1.30)

## 2013-12-16 ENCOUNTER — Telehealth: Payer: Self-pay | Admitting: General Practice

## 2013-12-16 DIAGNOSIS — E349 Endocrine disorder, unspecified: Secondary | ICD-10-CM

## 2013-12-22 NOTE — Telephone Encounter (Signed)
REFERRAL TO UROLOGY PUT IN TODAY

## 2013-12-27 ENCOUNTER — Telehealth: Payer: Self-pay | Admitting: General Practice

## 2014-01-11 ENCOUNTER — Ambulatory Visit (INDEPENDENT_AMBULATORY_CARE_PROVIDER_SITE_OTHER): Payer: BC Managed Care – PPO | Admitting: Internal Medicine

## 2014-01-11 ENCOUNTER — Encounter: Payer: Self-pay | Admitting: Internal Medicine

## 2014-01-11 VITALS — BP 157/107 | HR 87 | Temp 98.3°F | Ht 69.0 in | Wt 175.0 lb

## 2014-01-11 DIAGNOSIS — F319 Bipolar disorder, unspecified: Secondary | ICD-10-CM

## 2014-01-11 DIAGNOSIS — B20 Human immunodeficiency virus [HIV] disease: Secondary | ICD-10-CM

## 2014-01-11 DIAGNOSIS — F909 Attention-deficit hyperactivity disorder, unspecified type: Secondary | ICD-10-CM | POA: Insufficient documentation

## 2014-01-11 NOTE — Progress Notes (Addendum)
   Subjective:    Patient ID: Juanetta GoslingCameron D Mcpartland, male    DOB: 08/17/1981, 33 y.o.   MRN: [id number]  HPI Comes in to reestablish care.  Last seen 3 years ago.  Has HIV acquired around 2006 and has been on Atripla since that time.  Was off for about 9 months due to cost issues but restarted about 2 weeks prior to recent labs.  Unfortunately, has detectable virus to 5,000.  CD4 down to 590.  Recently diagnosed with bipolar II, ADHD.  Weaning off of narcotics. Sees psychiatry for those.  Has a PCP.  No weight loss, no diarrhea.     Review of Systems  Constitutional: Negative for fatigue.  HENT: Negative for trouble swallowing.   Gastrointestinal: Negative for nausea, abdominal pain and diarrhea.  Skin: Negative for rash.  Neurological: Negative for dizziness, light-headedness and headaches.  Psychiatric/Behavioral: The patient is nervous/anxious.        Objective:   Physical Exam  Constitutional: He appears well-developed and well-nourished. No distress.  HENT:  Mouth/Throat: No oropharyngeal exudate.  Eyes: Right eye exhibits no discharge. Left eye exhibits no discharge. No scleral icterus.  Cardiovascular: Normal rate, regular rhythm and normal heart sounds.   No murmur heard. Pulmonary/Chest: Effort normal and breath sounds normal. No respiratory distress. He has no wheezes.  Lymphadenopathy:    He has no cervical adenopathy.  Skin: No rash noted.  Psychiatric: He has a normal mood and affect.          Assessment & Plan:

## 2014-01-11 NOTE — Assessment & Plan Note (Signed)
I suspect resistance with detectable virus on Atripla.  I will check genotype and rtc in 2 weeks to discuss different medication.

## 2014-01-12 LAB — HIV-1 RNA ULTRAQUANT REFLEX TO GENTYP+
HIV 1 RNA QUANT: 21 {copies}/mL (ref <20)
HIV-1 RNA Quant, Log: 1.32 {Log} — ABNORMAL HIGH (ref <1.30)

## 2014-01-25 ENCOUNTER — Ambulatory Visit (INDEPENDENT_AMBULATORY_CARE_PROVIDER_SITE_OTHER): Payer: BC Managed Care – PPO | Admitting: Internal Medicine

## 2014-01-25 ENCOUNTER — Encounter: Payer: Self-pay | Admitting: Internal Medicine

## 2014-01-25 VITALS — BP 126/84 | HR 73 | Temp 98.5°F | Ht 71.0 in | Wt 175.0 lb

## 2014-01-25 DIAGNOSIS — B20 Human immunodeficiency virus [HIV] disease: Secondary | ICD-10-CM

## 2014-01-25 DIAGNOSIS — J309 Allergic rhinitis, unspecified: Secondary | ICD-10-CM

## 2014-01-25 MED ORDER — ELVITEG-COBIC-EMTRICIT-TENOFDF 150-150-200-300 MG PO TABS: 1.0000 | ORAL_TABLET | Freq: Every day | ORAL | Status: AC

## 2014-01-25 MED ORDER — FLUNISOLIDE 25 MCG/ACT (0.025%) NA SOLN: 2.0000 | Freq: Two times a day (BID) | NASAL | Status: DC

## 2014-01-25 NOTE — Progress Notes (Signed)
   Subjective:    Patient ID: Nicholas GoslingCameron D Welshans, male    DOB: 01/30/1981, 33 y.o.   MRN: [id number]  HPI  Comes in for follow up of HIV.  Was out of care for 3 years but reported he was taking Atripla.  Had been out but back on prior to recent labs when viral load was up to 5,000. Rechecked with genotpye and down to 21 copies.  Not sure of reason (was he on sufficient amount of time as he claimed?).  Recently diagnosed with bipolar II, ADHD.  Weaning off of narcotics. Sees psychiatry for those.  Has a PCP.  No weight loss, no diarrhea.     Review of Systems  Constitutional: Negative for fatigue.  HENT: Negative for trouble swallowing.   Gastrointestinal: Negative for nausea, abdominal pain and diarrhea.  Skin: Negative for rash.  Neurological: Negative for dizziness, light-headedness and headaches.       Objective:   Physical Exam  Constitutional: He appears well-developed and well-nourished. No distress.  HENT:  Mouth/Throat: No oropharyngeal exudate.  Eyes: Right eye exhibits no discharge. Left eye exhibits no discharge. No scleral icterus.  Cardiovascular: Normal rate, regular rhythm and normal heart sounds.   No murmur heard. Pulmonary/Chest: Effort normal and breath sounds normal. No respiratory distress. He has no wheezes.  Lymphadenopathy:    He has no cervical adenopathy.  Skin: No rash noted.  Psychiatric: He has a normal mood and affect.          Assessment & Plan:

## 2014-01-25 NOTE — Assessment & Plan Note (Signed)
He appears to not have any resistance. I suspect he was just not on for a sufficient amount of time.  I will though change to Stribild which will be a better choice long term.  Told him to avoid Flonase.  RTC in 4 weeks after labs.

## 2014-01-25 NOTE — Assessment & Plan Note (Signed)
I prescribed flunisolide if he needs something for allergies.  To avoid Flonase due to interaction with Stribild (cobicistat).

## 2014-01-27 ENCOUNTER — Ambulatory Visit: Payer: BC Managed Care – PPO | Admitting: Internal Medicine

## 2014-01-31 ENCOUNTER — Other Ambulatory Visit (INDEPENDENT_AMBULATORY_CARE_PROVIDER_SITE_OTHER): Payer: BC Managed Care – PPO

## 2014-01-31 DIAGNOSIS — E291 Testicular hypofunction: Secondary | ICD-10-CM

## 2014-01-31 DIAGNOSIS — R7989 Other specified abnormal findings of blood chemistry: Secondary | ICD-10-CM

## 2014-01-31 NOTE — Progress Notes (Signed)
Patient came in for labs only.

## 2014-02-03 LAB — ESTROGENS, TOTAL: Estrogen: 42 pg/mL (ref 40–115)

## 2014-02-03 LAB — LUTEINIZING HORMONE: LH: 8.4 m[IU]/mL (ref 1.7–8.6)

## 2014-02-03 LAB — PROLACTIN: Prolactin: 10.2 ng/mL (ref 4.0–15.2)

## 2014-02-03 LAB — FOLLICLE STIMULATING HORMONE: FSH: 7.9 m[IU]/mL (ref 1.5–12.4)

## 2014-02-03 LAB — TESTOSTERONE,FREE AND TOTAL
Testosterone, Free: 8.1 pg/mL — ABNORMAL LOW (ref 8.7–25.1)
Testosterone: 563 ng/dL (ref 348–1197)

## 2014-02-08 ENCOUNTER — Other Ambulatory Visit (INDEPENDENT_AMBULATORY_CARE_PROVIDER_SITE_OTHER): Payer: BC Managed Care – PPO

## 2014-02-08 DIAGNOSIS — E291 Testicular hypofunction: Secondary | ICD-10-CM

## 2014-02-09 LAB — TSH: TSH: 2.29 u[IU]/mL (ref 0.450–4.500)

## 2014-02-09 LAB — SEX HORMONE BINDING GLOBULIN: SEX HORMONE BINDING: 36.3 nmol/L (ref 16.5–55.9)

## 2014-02-15 ENCOUNTER — Other Ambulatory Visit: Payer: BC Managed Care – PPO

## 2014-02-18 ENCOUNTER — Ambulatory Visit (INDEPENDENT_AMBULATORY_CARE_PROVIDER_SITE_OTHER): Payer: BC Managed Care – PPO | Admitting: Family Medicine

## 2014-02-18 VITALS — BP 125/80 | HR 94 | Temp 97.8°F | Ht 71.0 in | Wt 177.0 lb

## 2014-02-18 DIAGNOSIS — E291 Testicular hypofunction: Secondary | ICD-10-CM

## 2014-02-18 NOTE — Progress Notes (Signed)
   Subjective:    Patient ID: ISSAIH FLIPPEN, male    DOB: 1981-08-07, 33 y.o.   MRN: [id number]  HPI This 33 y.o. male presents for evaluation of hypogonadism.  He has had labs ordered by Dr. Christell Constant and has low total testosterone.  He has seen Dr. Jerilee Field urology for testosterone replacement and the consult is obtained and Dr. Mena Goes recommends no tx of hypogonadism with replacement therapy because of the long term commitment and due to his age and to reduce or wean off the narcotics which cause low testosterone and to not rx Replacement at this time.  Dr. Christell Constant is given consult as well and he comes into room with patient and explains to patient that the best thing to do is to follow up with Dr. Mena Goes his urologist for his hypogonadism and not engage primary care since  He was referred by [place] originally for this.   Review of Systems    No chest pain, SOB, HA, dizziness, vision change, N/V, diarrhea, constipation, dysuria, urinary urgency or frequency, myalgias, arthralgias or rash.  Objective:   Physical Exam Patient is not examined     Assessment & Plan:  Hypogonadism male Recommend follow up with Dr. Mena Goes Patient given copy of consult  Deatra Canter FNP

## 2014-02-22 ENCOUNTER — Ambulatory Visit: Payer: BC Managed Care – PPO | Admitting: Internal Medicine

## 2014-02-23 ENCOUNTER — Telehealth: Payer: Self-pay | Admitting: *Deleted

## 2014-02-23 NOTE — Telephone Encounter (Signed)
Rescheduled patient. He is currently in the midst of final exams. Andree Coss, RN

## 2014-03-07 ENCOUNTER — Other Ambulatory Visit (INDEPENDENT_AMBULATORY_CARE_PROVIDER_SITE_OTHER): Payer: BC Managed Care – PPO

## 2014-03-07 DIAGNOSIS — B20 Human immunodeficiency virus [HIV] disease: Secondary | ICD-10-CM

## 2014-03-07 LAB — COMPLETE METABOLIC PANEL WITH GFR
ALT: 15 U/L (ref 0–53)
AST: 22 U/L (ref 0–37)
Albumin: 4.2 g/dL (ref 3.5–5.2)
Alkaline Phosphatase: 70 U/L (ref 39–117)
BUN: 8 mg/dL (ref 6–23)
CO2: 26 meq/L (ref 19–32)
CREATININE: 0.9 mg/dL (ref 0.50–1.35)
Calcium: 9.2 mg/dL (ref 8.4–10.5)
Chloride: 102 mEq/L (ref 96–112)
Glucose, Bld: 107 mg/dL — ABNORMAL HIGH (ref 70–99)
Potassium: 4 mEq/L (ref 3.5–5.3)
Sodium: 137 mEq/L (ref 135–145)
Total Bilirubin: 0.3 mg/dL (ref 0.2–1.2)
Total Protein: 7.5 g/dL (ref 6.0–8.3)

## 2014-03-07 LAB — CBC WITH DIFFERENTIAL/PLATELET
Basophils Absolute: 0.1 10*3/uL (ref 0.0–0.1)
Basophils Relative: 1 % (ref 0–1)
EOS ABS: 0.2 10*3/uL (ref 0.0–0.7)
Eosinophils Relative: 3 % (ref 0–5)
HCT: 38 % — ABNORMAL LOW (ref 39.0–52.0)
Hemoglobin: 13.7 g/dL (ref 13.0–17.0)
LYMPHS ABS: 2.6 10*3/uL (ref 0.7–4.0)
LYMPHS PCT: 40 % (ref 12–46)
MCH: 31.1 pg (ref 26.0–34.0)
MCHC: 36.1 g/dL — ABNORMAL HIGH (ref 30.0–36.0)
MCV: 86.4 fL (ref 78.0–100.0)
Monocytes Absolute: 0.6 10*3/uL (ref 0.1–1.0)
Monocytes Relative: 9 % (ref 3–12)
NEUTROS PCT: 47 % (ref 43–77)
Neutro Abs: 3.1 10*3/uL (ref 1.7–7.7)
PLATELETS: 238 10*3/uL (ref 150–400)
RBC: 4.4 MIL/uL (ref 4.22–5.81)
RDW: 14.6 % (ref 11.5–15.5)
WBC: 6.5 10*3/uL (ref 4.0–10.5)

## 2014-03-09 ENCOUNTER — Ambulatory Visit: Payer: BC Managed Care – PPO | Admitting: Internal Medicine

## 2014-03-09 LAB — T-HELPER CELL (CD4) - (RCID CLINIC ONLY)
CD4 % Helper T Cell: 20 % — ABNORMAL LOW (ref 33–55)
CD4 T CELL ABS: 550 /uL (ref 400–2700)

## 2014-03-09 LAB — HIV-1 RNA QUANT-NO REFLEX-BLD: HIV-1 RNA Quant, Log: <1.3 {Log} (ref <1.30)

## 2014-03-16 ENCOUNTER — Ambulatory Visit: Payer: BC Managed Care – PPO | Admitting: Internal Medicine

## 2014-03-30 ENCOUNTER — Ambulatory Visit: Payer: BC Managed Care – PPO | Admitting: Internal Medicine

## 2016-03-19 ENCOUNTER — Telehealth: Payer: Self-pay | Admitting: Pediatrics

## 2016-03-19 NOTE — Telephone Encounter (Signed)
Denied, [facility]
